# Patient Record
Sex: Female | Born: 1980 | Race: White | Hispanic: No | Marital: Married | Smoking: Never smoker
Health system: Southern US, Community
[De-identification: ages and names within clinical notes are randomized; demographics above are authoritative.]

## PROBLEM LIST (undated history)

## (undated) DIAGNOSIS — R102 Pelvic and perineal pain: Secondary | ICD-10-CM

## (undated) DIAGNOSIS — J45909 Unspecified asthma, uncomplicated: Secondary | ICD-10-CM

## (undated) DIAGNOSIS — T7840XA Allergy, unspecified, initial encounter: Secondary | ICD-10-CM

## (undated) DIAGNOSIS — F32A Depression, unspecified: Secondary | ICD-10-CM

## (undated) DIAGNOSIS — F909 Attention-deficit hyperactivity disorder, unspecified type: Secondary | ICD-10-CM

## (undated) DIAGNOSIS — D649 Anemia, unspecified: Secondary | ICD-10-CM

## (undated) DIAGNOSIS — F419 Anxiety disorder, unspecified: Secondary | ICD-10-CM

## (undated) DIAGNOSIS — T8859XA Other complications of anesthesia, initial encounter: Secondary | ICD-10-CM

## (undated) DIAGNOSIS — K589 Irritable bowel syndrome without diarrhea: Secondary | ICD-10-CM

## (undated) HISTORY — DX: Allergy, unspecified, initial encounter: T78.40XA

## (undated) HISTORY — DX: Unspecified asthma, uncomplicated: J45.909

## (undated) HISTORY — DX: Irritable bowel syndrome, unspecified: K58.9

---
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
## 2000-01-12 ENCOUNTER — Emergency Department (HOSPITAL_COMMUNITY): Admission: EM | Admit: 2000-01-12 | Discharge: 2000-01-12 | Payer: Self-pay | Admitting: Emergency Medicine

## 2004-09-29 HISTORY — PX: RHINOPLASTY: SHX2354

## 2009-06-05 ENCOUNTER — Inpatient Hospital Stay (HOSPITAL_COMMUNITY): Admission: AD | Admit: 2009-06-05 | Discharge: 2009-06-05 | Payer: Self-pay | Admitting: Obstetrics and Gynecology

## 2009-06-13 ENCOUNTER — Inpatient Hospital Stay (HOSPITAL_COMMUNITY): Admission: AD | Admit: 2009-06-13 | Discharge: 2009-06-15 | Payer: Self-pay | Admitting: Obstetrics and Gynecology

## 2009-10-18 ENCOUNTER — Other Ambulatory Visit: Admission: RE | Admit: 2009-10-18 | Discharge: 2009-10-18 | Payer: Self-pay | Admitting: Obstetrics and Gynecology

## 2011-01-03 LAB — RPR: RPR Ser Ql: NONREACTIVE

## 2011-01-03 LAB — CBC
HCT: 29.6 % — ABNORMAL LOW (ref 36.0–46.0)
HCT: 36.2 % (ref 36.0–46.0)
Hemoglobin: 9.8 g/dL — ABNORMAL LOW (ref 12.0–15.0)
Hemoglobin: 12.2 g/dL (ref 12.0–15.0)
MCHC: 33.2 g/dL (ref 30.0–36.0)
MCHC: 33.8 g/dL (ref 30.0–36.0)
MCV: 82.8 fL (ref 78.0–100.0)
MCV: 82 fL (ref 78.0–100.0)
Platelets: 201 10*3/uL (ref 150–400)
Platelets: 258 10*3/uL (ref 150–400)
RBC: 3.58 MIL/uL — ABNORMAL LOW (ref 3.87–5.11)
RBC: 4.41 MIL/uL (ref 3.87–5.11)
RDW: 14.8 % (ref 11.5–15.5)
RDW: 14.3 % (ref 11.5–15.5)
WBC: 17.5 10*3/uL — ABNORMAL HIGH (ref 4.0–10.5)
WBC: 10.7 10*3/uL — ABNORMAL HIGH (ref 4.0–10.5)

## 2011-01-08 ENCOUNTER — Other Ambulatory Visit: Payer: Self-pay | Admitting: Family Medicine

## 2011-01-08 ENCOUNTER — Other Ambulatory Visit (HOSPITAL_COMMUNITY)
Admission: RE | Admit: 2011-01-08 | Discharge: 2011-01-08 | Disposition: A | Discharge status: Home / Self Care | Payer: 59 | Source: Ambulatory Visit | Attending: Family Medicine | Admitting: Family Medicine

## 2011-01-08 DIAGNOSIS — Z124 Encounter for screening for malignant neoplasm of cervix: Secondary | ICD-10-CM | POA: Insufficient documentation

## 2012-07-15 ENCOUNTER — Other Ambulatory Visit (HOSPITAL_COMMUNITY): Payer: Self-pay | Admitting: Gastroenterology

## 2012-07-15 DIAGNOSIS — R1011 Right upper quadrant pain: Secondary | ICD-10-CM

## 2012-07-19 ENCOUNTER — Encounter (HOSPITAL_COMMUNITY)
Admission: RE | Admit: 2012-07-19 | Discharge: 2012-07-19 | Disposition: A | Discharge status: Home / Self Care | Payer: BC Managed Care – PPO | Source: Ambulatory Visit | Attending: Gastroenterology | Admitting: Gastroenterology

## 2012-07-19 DIAGNOSIS — R1011 Right upper quadrant pain: Secondary | ICD-10-CM

## 2012-07-19 MED ORDER — SINCALIDE 5 MCG IJ SOLR
0.0200 ug/kg | Freq: Once | INTRAMUSCULAR | Status: AC
  Administered 2012-07-19: 1.3 ug via INTRAVENOUS

## 2012-07-19 MED ORDER — TECHNETIUM TC 99M MEBROFENIN IV KIT
5.0000 | PACK | Freq: Once | INTRAVENOUS | Status: AC | PRN
  Administered 2012-07-19: 5 via INTRAVENOUS

## 2012-07-20 ENCOUNTER — Encounter (INDEPENDENT_AMBULATORY_CARE_PROVIDER_SITE_OTHER): Payer: Self-pay | Admitting: Surgery

## 2012-07-21 ENCOUNTER — Encounter (INDEPENDENT_AMBULATORY_CARE_PROVIDER_SITE_OTHER): Payer: Self-pay | Admitting: Surgery

## 2012-07-21 ENCOUNTER — Ambulatory Visit (INDEPENDENT_AMBULATORY_CARE_PROVIDER_SITE_OTHER): Payer: BC Managed Care – PPO | Admitting: Surgery

## 2012-07-21 VITALS — BP 100/62 | HR 54 | Resp 12 | Ht 61.0 in | Wt 147.0 lb

## 2012-07-21 DIAGNOSIS — K811 Chronic cholecystitis: Secondary | ICD-10-CM

## 2012-07-21 NOTE — Progress Notes (Signed)
Patient ID: Tamara Wilson, female   DOB: Sep 01, 1981, 31 y.o.   MRN: [id number]  Chief Complaint  Patient presents with  . Other    abdominal pain    HPI Tamara Wilson is a 31 y.o. female.   HPIshe is referred by Dr. Dulce Sellar for evaluation of abdominal pain. She's been having epigastric and right upper quadrant abdominal pain hurting through to her back for about 3 weeks. She has had nausea but no vomiting. She has had a history of irritable bowel syndrome. The pain is moderate in intensity and sharp. She has loose bowel movements. She is otherwise without complaints  Past Medical History  Diagnosis Date  . Allergy   . IBS (irritable bowel syndrome)   . Asthma     Past Surgical History  Procedure Date  . Tonsillectomy 1986  . Rhinoplasty 2006    History reviewed. No pertinent family history.  Social History History  Substance Use Topics  . Smoking status: Never Smoker   . Smokeless tobacco: Not on file  . Alcohol Use: Yes     rare beer    Allergies  Allergen Reactions  . Sulfa Antibiotics     Current Outpatient Prescriptions  Medication Sig Dispense Refill  . fexofenadine (ALLEGRA) 180 MG tablet Take 180 mg by mouth daily.      . norethindrone-ethinyl estradiol (MICROGESTIN,JUNEL,LOESTRIN) 1-20 MG-MCG tablet Take 1 tablet by mouth daily.      Marland Kitchen omeprazole (PRILOSEC) 20 MG capsule Take 20 mg by mouth daily.      . polyethylene glycol (MIRALAX / GLYCOLAX) packet Take 17 g by mouth daily.      . Probiotic Product (SOLUBLE FIBER/PROBIOTICS PO) Take by mouth 2 (two) times daily.        Review of Systems Review of Systems  Constitutional: Negative for fever, chills and unexpected weight change.  HENT: Negative for hearing loss, congestion, sore throat, trouble swallowing and voice change.   Eyes: Negative for visual disturbance.  Respiratory: Negative for cough and wheezing.   Cardiovascular: Negative for chest pain, palpitations and leg swelling.    Gastrointestinal: Positive for nausea, abdominal pain and diarrhea. Negative for vomiting, constipation, blood in stool, abdominal distention and anal bleeding.  Genitourinary: Negative for hematuria, vaginal bleeding and difficulty urinating.  Musculoskeletal: Negative for arthralgias.  Skin: Negative for rash and wound.  Neurological: Negative for seizures, syncope and headaches.  Hematological: Negative for adenopathy. Does not bruise/bleed easily.  Psychiatric/Behavioral: Negative for confusion.    Blood pressure 100/62, pulse 54, resp. rate 12, height 5\' 1"  (1.549 m), weight 147 lb (66.679 kg), last menstrual period 07/10/2012.  Physical Exam Physical Exam  Constitutional: She is oriented to person, place, and time. She appears well-developed and well-nourished. No distress.  HENT:  Head: Normocephalic and atraumatic.  Right Ear: External ear normal.  Left Ear: External ear normal.  Nose: Nose normal.  Mouth/Throat: Oropharynx is clear and moist. No oropharyngeal exudate.  Eyes: Conjunctivae normal are normal. Pupils are equal, round, and reactive to light. Right eye exhibits no discharge. Left eye exhibits no discharge. No scleral icterus.  Neck: Normal range of motion. Neck supple. No tracheal deviation present. No thyromegaly present.  Cardiovascular: Normal rate, regular rhythm, normal heart sounds and intact distal pulses.   Pulmonary/Chest: Effort normal and breath sounds normal. No respiratory distress. She has no wheezes. She has no rales.  Abdominal: Soft. Bowel sounds are normal. There is tenderness. There is guarding. There is no rebound.  There is mild tenderness with guarding in the right upper quadrant  Musculoskeletal: Normal range of motion. She exhibits no edema and no tenderness.  Lymphadenopathy:    She has no cervical adenopathy.  Neurological: She is alert and oriented to person, place, and time.  Skin: Skin is warm and dry. No rash noted. She is not  diaphoretic. No erythema.  Psychiatric: Her behavior is normal. Judgment normal.    Data Reviewed I have reviewed her ultrasound, HIDA, and endoscopy report  Assessment    Biliary dyskinesia and chronic cholecystitis    Plan    After a long discussion, she is eager to proceed with laparoscopic cholecystectomy. I discussed this with her in detail. I discussed the risk of surgery which includes but is not limited to bleeding, infection, bile duct injury, bile leak, injury to other structures, the chance this may not resolve her symptoms, etc. She understands and wishes to proceed. Surgery will thus be scheduled. Likelihood of success is good       Afton Lavalle A 07/21/2012, 3:26 PM

## 2012-07-21 NOTE — Progress Notes (Deleted)
Subjective:     Patient ID: Tamara Wilson, female   DOB: Jun 08, 1981, 31 y.o.   MRN: [id number]  HPI   Review of Systems     Objective:   Physical Exam     Assessment:     ***    Plan:     ***

## 2012-07-22 ENCOUNTER — Encounter (HOSPITAL_COMMUNITY): Payer: Self-pay | Admitting: Pharmacy Technician

## 2012-07-22 ENCOUNTER — Encounter (INDEPENDENT_AMBULATORY_CARE_PROVIDER_SITE_OTHER): Payer: Self-pay | Admitting: General Surgery

## 2012-08-03 ENCOUNTER — Encounter (HOSPITAL_COMMUNITY)
Admission: RE | Admit: 2012-08-03 | Discharge: 2012-08-03 | Disposition: A | Discharge status: Home / Self Care | Payer: BC Managed Care – PPO | Source: Ambulatory Visit | Attending: Surgery | Admitting: Surgery

## 2012-08-03 ENCOUNTER — Encounter (HOSPITAL_COMMUNITY): Payer: Self-pay

## 2012-08-03 LAB — CBC
HCT: 39.5 % (ref 36.0–46.0)
Hemoglobin: 13.6 g/dL (ref 12.0–15.0)
MCH: 29.2 pg (ref 26.0–34.0)
MCHC: 34.4 g/dL (ref 30.0–36.0)
MCV: 84.8 fL (ref 78.0–100.0)
Platelets: 267 10*3/uL (ref 150–400)
RBC: 4.66 MIL/uL (ref 3.87–5.11)
RDW: 13.2 % (ref 11.5–15.5)
WBC: 8.9 10*3/uL (ref 4.0–10.5)

## 2012-08-03 LAB — SURGICAL PCR SCREEN
MRSA, PCR: NEGATIVE
Staphylococcus aureus: POSITIVE — AB

## 2012-08-03 LAB — HCG, SERUM, QUALITATIVE: Preg, Serum: NEGATIVE

## 2012-08-03 NOTE — Pre-Procedure Instructions (Signed)
20 Armella P Sakai  08/03/2012   Your procedure is scheduled on:  November 8  Report to [facility] at 10:30 AM.  Call this number if you have problems the morning of surgery: [phone]   Remember:   Do not eat or drink:After Midnight.  Take these medicines the morning of surgery with A SIP OF WATER: Birth Control, Omeprazole   Do not wear jewelry, make-up or nail polish.  Do not wear lotions, powders, or perfumes. You may wear deodorant.  Do not shave 48 hours prior to surgery. Men may shave face and neck.  Do not bring valuables to the hospital.  Contacts, dentures or bridgework may not be worn into surgery.  Leave suitcase in the car. After surgery it may be brought to your room.  For patients admitted to the hospital, checkout time is 11:00 AM the day of discharge.   Patients discharged the day of surgery will not be allowed to drive home.  Name and phone number of your driver: Husband  Special Instructions: Shower using CHG 2 nights before surgery and the night before surgery.  If you shower the day of surgery use CHG.  Use special wash - you have one bottle of CHG for all showers.  You should use approximately 1/3 of the bottle for each shower.   Please read over the following fact sheets that you were given: Pain Booklet, Coughing and Deep Breathing and Surgical Site Infection Prevention

## 2012-08-05 MED ORDER — CEFAZOLIN SODIUM-DEXTROSE 2-3 GM-% IV SOLR
2.0000 g | INTRAVENOUS | Status: AC
  Administered 2012-08-06: 2 g via INTRAVENOUS

## 2012-08-05 NOTE — H&P (Signed)
Patient ID: Tamara Wilson, female DOB: 10-30-1980, 31 y.o. MRN: [id number]  Chief Complaint   Patient presents with   .  Other     abdominal pain    HPI  Tamara Wilson is a 31 y.o. female.  HPIshe is referred by Dr. Dulce Sellar for evaluation of abdominal pain. She's been having epigastric and right upper quadrant abdominal pain hurting through to her back for about 3 weeks. She has had nausea but no vomiting. She has had a history of irritable bowel syndrome. The pain is moderate in intensity and sharp. She has loose bowel movements. She is otherwise without complaints  Past Medical History   Diagnosis  Date   .  Allergy    .  IBS (irritable bowel syndrome)    .  Asthma     Past Surgical History   Procedure  Date   .  Tonsillectomy  1986   .  Rhinoplasty  2006    History reviewed. No pertinent family history.  Social History  History   Substance Use Topics   .  Smoking status:  Never Smoker   .  Smokeless tobacco:  Not on file   .  Alcohol Use:  Yes      rare beer    Allergies   Allergen  Reactions   .  Sulfa Antibiotics     Current Outpatient Prescriptions   Medication  Sig  Dispense  Refill   .  fexofenadine (ALLEGRA) 180 MG tablet  Take 180 mg by mouth daily.     .  norethindrone-ethinyl estradiol (MICROGESTIN,JUNEL,LOESTRIN) 1-20 MG-MCG tablet  Take 1 tablet by mouth daily.     Marland Kitchen  omeprazole (PRILOSEC) 20 MG capsule  Take 20 mg by mouth daily.     .  polyethylene glycol (MIRALAX / GLYCOLAX) packet  Take 17 g by mouth daily.     .  Probiotic Product (SOLUBLE FIBER/PROBIOTICS PO)  Take by mouth 2 (two) times daily.      Review of Systems  Review of Systems  Constitutional: Negative for fever, chills and unexpected weight change.  HENT: Negative for hearing loss, congestion, sore throat, trouble swallowing and voice change.  Eyes: Negative for visual disturbance.  Respiratory: Negative for cough and wheezing.  Cardiovascular: Negative for chest pain, palpitations  and leg swelling.  Gastrointestinal: Positive for nausea, abdominal pain and diarrhea. Negative for vomiting, constipation, blood in stool, abdominal distention and anal bleeding.  Genitourinary: Negative for hematuria, vaginal bleeding and difficulty urinating.  Musculoskeletal: Negative for arthralgias.  Skin: Negative for rash and wound.  Neurological: Negative for seizures, syncope and headaches.  Hematological: Negative for adenopathy. Does not bruise/bleed easily.  Psychiatric/Behavioral: Negative for confusion.   Blood pressure 100/62, pulse 54, resp. rate 12, height 5\' 1"  (1.549 m), weight 147 lb (66.679 kg), last menstrual period 07/10/2012.  Physical Exam  Physical Exam  Constitutional: She is oriented to person, place, and time. She appears well-developed and well-nourished. No distress.  HENT:  Head: Normocephalic and atraumatic.  Right Ear: External ear normal.  Left Ear: External ear normal.  Nose: Nose normal.  Mouth/Throat: Oropharynx is clear and moist. No oropharyngeal exudate.  Eyes: Conjunctivae normal are normal. Pupils are equal, round, and reactive to light. Right eye exhibits no discharge. Left eye exhibits no discharge. No scleral icterus.  Neck: Normal range of motion. Neck supple. No tracheal deviation present. No thyromegaly present.  Cardiovascular: Normal rate, regular rhythm, normal heart sounds and intact distal pulses.  Pulmonary/Chest: Effort normal and breath sounds normal. No respiratory distress. She has no wheezes. She has no rales.  Abdominal: Soft. Bowel sounds are normal. There is tenderness. There is guarding. There is no rebound.  There is mild tenderness with guarding in the right upper quadrant  Musculoskeletal: Normal range of motion. She exhibits no edema and no tenderness.  Lymphadenopathy:  She has no cervical adenopathy.  Neurological: She is alert and oriented to person, place, and time.  Skin: Skin is warm and dry. No rash noted. She is  not diaphoretic. No erythema.  Psychiatric: Her behavior is normal. Judgment normal.   Data Reviewed  I have reviewed her ultrasound, HIDA, and endoscopy report  Assessment   Biliary dyskinesia and chronic cholecystitis   Plan   After a long discussion, she is eager to proceed with laparoscopic cholecystectomy. I discussed this with her in detail. I discussed the risk of surgery which includes but is not limited to bleeding, infection, bile duct injury, bile leak, injury to other structures, the chance this may not resolve her symptoms, etc. She understands and wishes to proceed. Surgery will thus be scheduled. Likelihood of success is good   Tamara Wilson A

## 2012-08-06 ENCOUNTER — Encounter (HOSPITAL_COMMUNITY): Payer: Self-pay | Admitting: Anesthesiology

## 2012-08-06 ENCOUNTER — Encounter (HOSPITAL_COMMUNITY)
Admission: RE | Disposition: A | Discharge status: Home / Self Care | Payer: Self-pay | Source: Ambulatory Visit | Attending: Surgery

## 2012-08-06 ENCOUNTER — Ambulatory Visit (HOSPITAL_COMMUNITY)
Admission: RE | Admit: 2012-08-06 | Discharge: 2012-08-06 | Disposition: A | Discharge status: Home / Self Care | Payer: BC Managed Care – PPO | Source: Ambulatory Visit | Attending: Surgery | Admitting: Surgery

## 2012-08-06 ENCOUNTER — Ambulatory Visit (HOSPITAL_COMMUNITY): Payer: BC Managed Care – PPO | Admitting: Anesthesiology

## 2012-08-06 DIAGNOSIS — K811 Chronic cholecystitis: Secondary | ICD-10-CM

## 2012-08-06 DIAGNOSIS — K819 Cholecystitis, unspecified: Secondary | ICD-10-CM | POA: Insufficient documentation

## 2012-08-06 DIAGNOSIS — J45909 Unspecified asthma, uncomplicated: Secondary | ICD-10-CM | POA: Insufficient documentation

## 2012-08-06 DIAGNOSIS — Z01812 Encounter for preprocedural laboratory examination: Secondary | ICD-10-CM | POA: Insufficient documentation

## 2012-08-06 DIAGNOSIS — K828 Other specified diseases of gallbladder: Secondary | ICD-10-CM | POA: Insufficient documentation

## 2012-08-06 HISTORY — PX: CHOLECYSTECTOMY: SHX55

## 2012-08-06 SURGERY — LAPAROSCOPIC CHOLECYSTECTOMY
Anesthesia: General | Site: Abdomen | Wound class: Contaminated

## 2012-08-06 MED ORDER — SODIUM CHLORIDE 0.9 % IJ SOLN: 3.0000 mL | INTRAMUSCULAR | Status: DC | PRN

## 2012-08-06 MED ORDER — SODIUM CHLORIDE 0.9 % IV SOLN: 250.0000 mL | INTRAVENOUS | Status: DC | PRN

## 2012-08-06 MED ORDER — MORPHINE SULFATE 4 MG/ML IJ SOLN: 4.0000 mg | INTRAMUSCULAR | Status: DC | PRN

## 2012-08-06 MED ORDER — ONDANSETRON HCL 4 MG/2ML IJ SOLN
INTRAMUSCULAR | Status: DC | PRN
  Administered 2012-08-06: 4 mg via INTRAVENOUS

## 2012-08-06 MED ORDER — BUPIVACAINE-EPINEPHRINE PF 0.25-1:200000 % IJ SOLN
INTRAMUSCULAR | Status: AC
  Filled 2012-08-06: qty 30

## 2012-08-06 MED ORDER — LACTATED RINGERS IV SOLN
INTRAVENOUS | Status: DC | PRN
  Administered 2012-08-06: 11:00:00 via INTRAVENOUS

## 2012-08-06 MED ORDER — SODIUM CHLORIDE 0.9 % IJ SOLN: 3.0000 mL | Freq: Two times a day (BID) | INTRAMUSCULAR | Status: DC

## 2012-08-06 MED ORDER — CEFAZOLIN SODIUM 1-5 GM-% IV SOLN
INTRAVENOUS | Status: AC
  Filled 2012-08-06: qty 100

## 2012-08-06 MED ORDER — HYDROMORPHONE HCL PF 1 MG/ML IJ SOLN
INTRAMUSCULAR | Status: AC
  Filled 2012-08-06: qty 1

## 2012-08-06 MED ORDER — 0.9 % SODIUM CHLORIDE (POUR BTL) OPTIME
TOPICAL | Status: DC | PRN
  Administered 2012-08-06: 1000 mL

## 2012-08-06 MED ORDER — ONDANSETRON HCL 4 MG/2ML IJ SOLN: 4.0000 mg | Freq: Four times a day (QID) | INTRAMUSCULAR | Status: DC | PRN

## 2012-08-06 MED ORDER — OXYCODONE HCL 5 MG/5ML PO SOLN: 5.0000 mg | Freq: Once | ORAL | Status: DC | PRN

## 2012-08-06 MED ORDER — BUPIVACAINE-EPINEPHRINE 0.25% -1:200000 IJ SOLN
INTRAMUSCULAR | Status: DC | PRN
  Administered 2012-08-06: 20 mL

## 2012-08-06 MED ORDER — ACETAMINOPHEN 325 MG PO TABS: 650.0000 mg | ORAL_TABLET | ORAL | Status: DC | PRN

## 2012-08-06 MED ORDER — OXYCODONE HCL 5 MG PO TABS: 5.0000 mg | ORAL_TABLET | Freq: Once | ORAL | Status: DC | PRN

## 2012-08-06 MED ORDER — MIDAZOLAM HCL 5 MG/5ML IJ SOLN
INTRAMUSCULAR | Status: DC | PRN
  Administered 2012-08-06: 2 mg via INTRAVENOUS

## 2012-08-06 MED ORDER — LIDOCAINE HCL (CARDIAC) 20 MG/ML IV SOLN
INTRAVENOUS | Status: DC | PRN
  Administered 2012-08-06: 80 mg via INTRAVENOUS

## 2012-08-06 MED ORDER — MIDAZOLAM HCL 2 MG/2ML IJ SOLN: 1.0000 mg | INTRAMUSCULAR | Status: DC | PRN

## 2012-08-06 MED ORDER — ROCURONIUM BROMIDE 100 MG/10ML IV SOLN
INTRAVENOUS | Status: DC | PRN
  Administered 2012-08-06: 25 mg via INTRAVENOUS

## 2012-08-06 MED ORDER — PROPOFOL 10 MG/ML IV BOLUS
INTRAVENOUS | Status: DC | PRN
  Administered 2012-08-06: 180 mg via INTRAVENOUS

## 2012-08-06 MED ORDER — FENTANYL CITRATE 0.05 MG/ML IJ SOLN
INTRAMUSCULAR | Status: DC | PRN
  Administered 2012-08-06 (×2): 50 ug via INTRAVENOUS
  Administered 2012-08-06: 100 ug via INTRAVENOUS
  Administered 2012-08-06: 50 ug via INTRAVENOUS

## 2012-08-06 MED ORDER — KETOROLAC TROMETHAMINE 30 MG/ML IJ SOLN
INTRAMUSCULAR | Status: DC | PRN
  Administered 2012-08-06: 30 mg via INTRAVENOUS

## 2012-08-06 MED ORDER — ACETAMINOPHEN 650 MG RE SUPP: 650.0000 mg | RECTAL | Status: DC | PRN

## 2012-08-06 MED ORDER — GLYCOPYRROLATE 0.2 MG/ML IJ SOLN
INTRAMUSCULAR | Status: DC | PRN
  Administered 2012-08-06: 0.2 mg via INTRAVENOUS
  Administered 2012-08-06: 0.4 mg via INTRAVENOUS
  Administered 2012-08-06: 0.2 mg via INTRAVENOUS

## 2012-08-06 MED ORDER — HYDROCODONE-ACETAMINOPHEN 5-325 MG PO TABS: 1.0000 | ORAL_TABLET | ORAL | Status: DC | PRN

## 2012-08-06 MED ORDER — SODIUM CHLORIDE 0.9 % IR SOLN
Status: DC | PRN
  Administered 2012-08-06: 1000 mL

## 2012-08-06 MED ORDER — FENTANYL CITRATE 0.05 MG/ML IJ SOLN: 50.0000 ug | Freq: Once | INTRAMUSCULAR | Status: DC

## 2012-08-06 MED ORDER — HYDROMORPHONE HCL PF 1 MG/ML IJ SOLN
0.2500 mg | INTRAMUSCULAR | Status: DC | PRN
  Administered 2012-08-06: 0.5 mg via INTRAVENOUS

## 2012-08-06 MED ORDER — OXYCODONE HCL 5 MG PO TABS: 5.0000 mg | ORAL_TABLET | ORAL | Status: DC | PRN

## 2012-08-06 MED ORDER — PROMETHAZINE HCL 25 MG/ML IJ SOLN: 6.2500 mg | INTRAMUSCULAR | Status: DC | PRN

## 2012-08-06 MED ORDER — NEOSTIGMINE METHYLSULFATE 1 MG/ML IJ SOLN
INTRAMUSCULAR | Status: DC | PRN
  Administered 2012-08-06: 3 mg via INTRAVENOUS

## 2012-08-06 SURGICAL SUPPLY — 45 items
APL SKNCLS STERI-STRIP NONHPOA (GAUZE/BANDAGES/DRESSINGS) ×1
APPLIER CLIP 5 13 M/L LIGAMAX5 (MISCELLANEOUS) ×2
APR CLP MED LRG 5 ANG JAW (MISCELLANEOUS) ×1
BAG SPEC RTRVL LRG 6X4 10 (ENDOMECHANICALS) ×1
BANDAGE ADHESIVE 1X3 (GAUZE/BANDAGES/DRESSINGS) ×5 IMPLANT
BENZOIN TINCTURE PRP APPL 2/3 (GAUZE/BANDAGES/DRESSINGS) ×2 IMPLANT
CANISTER SUCTION 2500CC (MISCELLANEOUS) ×2 IMPLANT
CHLORAPREP W/TINT 26ML (MISCELLANEOUS) ×2 IMPLANT
CLIP APPLIE 5 13 M/L LIGAMAX5 (MISCELLANEOUS) ×1 IMPLANT
CLOTH BEACON ORANGE TIMEOUT ST (SAFETY) ×2 IMPLANT
COVER MAYO STAND STRL (DRAPES) IMPLANT
COVER SURGICAL LIGHT HANDLE (MISCELLANEOUS) ×2 IMPLANT
DECANTER SPIKE VIAL GLASS SM (MISCELLANEOUS) ×2 IMPLANT
DRAPE C-ARM 42X72 X-RAY (DRAPES) IMPLANT
ELECT REM PT RETURN 9FT ADLT (ELECTROSURGICAL) ×2
ELECTRODE REM PT RTRN 9FT ADLT (ELECTROSURGICAL) ×1 IMPLANT
GLOVE BIO SURGEON STRL SZ7.5 (GLOVE) ×1 IMPLANT
GLOVE BIOGEL PI IND STRL 6.5 (GLOVE) IMPLANT
GLOVE BIOGEL PI IND STRL 7.0 (GLOVE) IMPLANT
GLOVE BIOGEL PI IND STRL 7.5 (GLOVE) IMPLANT
GLOVE BIOGEL PI INDICATOR 6.5 (GLOVE) ×1
GLOVE BIOGEL PI INDICATOR 7.0 (GLOVE) ×1
GLOVE BIOGEL PI INDICATOR 7.5 (GLOVE) ×2
GLOVE SURG SIGNA 7.5 PF LTX (GLOVE) ×2 IMPLANT
GLOVE SURG SS PI 6.5 STRL IVOR (GLOVE) ×1 IMPLANT
GOWN PREVENTION PLUS XLARGE (GOWN DISPOSABLE) ×2 IMPLANT
GOWN STRL NON-REIN LRG LVL3 (GOWN DISPOSABLE) ×6 IMPLANT
KIT BASIN OR (CUSTOM PROCEDURE TRAY) ×2 IMPLANT
KIT ROOM TURNOVER OR (KITS) ×2 IMPLANT
NS IRRIG 1000ML POUR BTL (IV SOLUTION) ×2 IMPLANT
PAD ARMBOARD 7.5X6 YLW CONV (MISCELLANEOUS) ×4 IMPLANT
POUCH SPECIMEN RETRIEVAL 10MM (ENDOMECHANICALS) ×1 IMPLANT
SCISSORS LAP 5X35 DISP (ENDOMECHANICALS) ×1 IMPLANT
SET CHOLANGIOGRAPH 5 50 .035 (SET/KITS/TRAYS/PACK) IMPLANT
SET IRRIG TUBING LAPAROSCOPIC (IRRIGATION / IRRIGATOR) ×2 IMPLANT
SLEEVE ENDOPATH XCEL 5M (ENDOMECHANICALS) ×4 IMPLANT
SPECIMEN JAR SMALL (MISCELLANEOUS) ×2 IMPLANT
STRIP CLOSURE SKIN 1/2X4 (GAUZE/BANDAGES/DRESSINGS) ×1 IMPLANT
SUT MON AB 4-0 PC3 18 (SUTURE) ×2 IMPLANT
TOWEL OR 17X24 6PK STRL BLUE (TOWEL DISPOSABLE) ×2 IMPLANT
TOWEL OR 17X26 10 PK STRL BLUE (TOWEL DISPOSABLE) ×2 IMPLANT
TRAY LAPAROSCOPIC (CUSTOM PROCEDURE TRAY) ×2 IMPLANT
TROCAR XCEL BLUNT TIP 100MML (ENDOMECHANICALS) ×2 IMPLANT
TROCAR XCEL NON-BLD 5MMX100MML (ENDOMECHANICALS) ×2 IMPLANT
WATER STERILE IRR 1000ML POUR (IV SOLUTION) IMPLANT

## 2012-08-06 NOTE — Anesthesia Preprocedure Evaluation (Signed)
Anesthesia Evaluation  Patient identified by MRN, date of birth, ID band Patient awake    Reviewed: Allergy & Precautions, H&P , NPO status , Patient's Chart, lab work & pertinent test results  Airway Mallampati: I TM Distance: >3 FB Neck ROM: Full    Dental   Pulmonary asthma ,  breath sounds clear to auscultation        Cardiovascular Rhythm:Regular Rate:Normal     Neuro/Psych    GI/Hepatic GERD-  ,  Endo/Other    Renal/GU      Musculoskeletal   Abdominal   Peds  Hematology   Anesthesia Other Findings   Reproductive/Obstetrics                           Anesthesia Physical Anesthesia Plan  ASA: II  Anesthesia Plan: General   Post-op Pain Management:    Induction: Intravenous  Airway Management Planned: Oral ETT  Additional Equipment:   Intra-op Plan:   Post-operative Plan: Extubation in OR  Informed Consent: I have reviewed the patients History and Physical, chart, labs and discussed the procedure including the risks, benefits and alternatives for the proposed anesthesia with the patient or authorized representative who has indicated his/her understanding and acceptance.     Plan Discussed with: CRNA and Surgeon  Anesthesia Plan Comments:         Anesthesia Quick Evaluation  

## 2012-08-06 NOTE — Interval H&P Note (Signed)
History and Physical Interval Note:  No change in H and P  08/06/2012 10:41 AM  Tamara Wilson  has presented today for surgery, with the diagnosis of chronic cholecystitis  The various methods of treatment have been discussed with the patient and family. After consideration of risks, benefits and other options for treatment, the patient has consented to  Procedure(s) (LRB) with comments: LAPAROSCOPIC CHOLECYSTECTOMY (N/A) as a surgical intervention .  The patient's history has been reviewed, patient examined, no change in status, stable for surgery.  I have reviewed the patient's chart and labs.  Questions were answered to the patient's satisfaction.     [name] A

## 2012-08-06 NOTE — Addendum Note (Signed)
Addendum  created 08/06/12 1337 by Sheppard Evens, CRNA   Modules edited:Anesthesia Medication Administration

## 2012-08-06 NOTE — Transfer of Care (Signed)
Immediate Anesthesia Transfer of Care Note  Patient: Tamara Wilson  Procedure(s) Performed: Procedure(s) (LRB) with comments: LAPAROSCOPIC CHOLECYSTECTOMY (N/A)  Patient Location: PACU  Anesthesia Type:General  Level of Consciousness: awake, alert  and oriented  Airway & Oxygen Therapy: Patient Spontanous Breathing  Post-op Assessment: Report given to PACU RN and Post -op Vital signs reviewed and stable  Post vital signs: Reviewed and stable  Complications: No apparent anesthesia complications

## 2012-08-06 NOTE — Addendum Note (Signed)
Addendum  created 08/06/12 1337 by Sumit Branham B Zebulen Simonis, CRNA   Modules edited:Anesthesia Medication Administration    

## 2012-08-06 NOTE — Anesthesia Postprocedure Evaluation (Signed)
  Anesthesia Post-op Note  Patient: Tamara Wilson  Procedure(s) Performed: Procedure(s) (LRB) with comments: LAPAROSCOPIC CHOLECYSTECTOMY (N/A)  Patient Location: PACU  Anesthesia Type:General  Level of Consciousness: awake  Airway and Oxygen Therapy: Patient Spontanous Breathing  Post-op Pain: mild  Post-op Assessment: Post-op Vital signs reviewed, Patient's Cardiovascular Status Stable, Respiratory Function Stable, Patent Airway, No signs of Nausea or vomiting and Pain level controlled  Post-op Vital Signs: stable  Complications: No apparent anesthesia complications

## 2012-08-06 NOTE — Op Note (Signed)
Laparoscopic Cholecystectomy Procedure Note  Indications: This patient presents with symptomatic gallbladder disease and will undergo laparoscopic cholecystectomy.  Pre-operative Diagnosis: biliary dyskinesia  Post-operative Diagnosis: Same  Surgeon: Abigail Miyamoto A   Assistants: 0  Anesthesia: General endotracheal anesthesia  ASA Class: 1  Procedure Details  The patient was seen again in the Holding Room. The risks, benefits, complications, treatment options, and expected outcomes were discussed with the patient. The possibilities of reaction to medication, pulmonary aspiration, perforation of viscus, bleeding, recurrent infection, finding a normal gallbladder, the need for additional procedures, failure to diagnose a condition, the possible need to convert to an open procedure, and creating a complication requiring transfusion or operation were discussed with the patient. The likelihood of improving the patient's symptoms with return to their baseline status is good.  The patient and/or family concurred with the proposed plan, giving informed consent. The site of surgery properly noted. The patient was taken to Operating Room, identified as Tamara Wilson and the procedure verified as Laparoscopic Cholecystectomy with Intraoperative Cholangiogram. A Time Out was held and the above information confirmed.  Prior to the induction of general anesthesia, antibiotic prophylaxis was administered. General endotracheal anesthesia was then administered and tolerated well. After the induction, the abdomen was prepped with Chloraprep and draped in sterile fashion. The patient was positioned in the supine position.  Local anesthetic agent was injected into the skin near the umbilicus and an incision made. We dissected down to the abdominal fascia with blunt dissection.  The fascia was incised vertically and we entered the peritoneal cavity bluntly.  A pursestring suture of 0-Vicryl was placed around  the fascial opening.  The Hasson cannula was inserted and secured with the stay suture.  Pneumoperitoneum was then created with CO2 and tolerated well without any adverse changes in the patient's vital signs. An 11-mm port was placed in the subxiphoid position.  Two 5-mm ports were placed in the right upper quadrant. All skin incisions were infiltrated with a local anesthetic agent before making the incision and placing the trocars.   We positioned the patient in reverse Trendelenburg, tilted slightly to the patient's left.  The gallbladder was identified, the fundus grasped and retracted cephalad. Adhesions were lysed bluntly and with the electrocautery where indicated, taking care not to injure any adjacent organs or viscus. The infundibulum was grasped and retracted laterally, exposing the peritoneum overlying the triangle of Calot. This was then divided and exposed in a blunt fashion. The cystic duct was clearly identified and bluntly dissected circumferentially. A critical view of the cystic duct and cystic artery was obtained.  The cystic duct was then ligated with clips and divided. The cystic artery was, dissected free, ligated with clips and divided as well.   The gallbladder was dissected from the liver bed in retrograde fashion with the electrocautery. The gallbladder was removed and placed in an Endocatch sac. The liver bed was irrigated and inspected. Hemostasis was achieved with the electrocautery. Copious irrigation was utilized and was repeatedly aspirated until clear.  The gallbladder and Endocatch sac were then removed through the umbilical port site.  The pursestring suture was used to close the umbilical fascia.    We again inspected the right upper quadrant for hemostasis.  Pneumoperitoneum was released as we removed the trocars.  4-0 Monocryl was used to close the skin.   Benzoin, steri-strips, and clean dressings were applied. The patient was then extubated and brought to the recovery  room in stable condition. Instrument, sponge, and needle  counts were correct at closure and at the conclusion of the case.   Findings: Cholecystitis without Cholelithiasis  Estimated Blood Loss: Minimal         Drains: 0         Specimens: Gallbladder           Complications: None; patient tolerated the procedure well.         Disposition: PACU - hemodynamically stable.         Condition: stable

## 2012-08-09 ENCOUNTER — Encounter (HOSPITAL_COMMUNITY): Payer: Self-pay | Admitting: Surgery

## 2012-08-23 ENCOUNTER — Encounter (INDEPENDENT_AMBULATORY_CARE_PROVIDER_SITE_OTHER): Payer: Self-pay | Admitting: Surgery

## 2012-08-23 ENCOUNTER — Ambulatory Visit (INDEPENDENT_AMBULATORY_CARE_PROVIDER_SITE_OTHER): Payer: BC Managed Care – PPO | Admitting: Surgery

## 2012-08-23 VITALS — BP 127/80 | HR 76 | Temp 97.6°F | Resp 16 | Ht 61.5 in | Wt 146.2 lb

## 2012-08-23 DIAGNOSIS — Z09 Encounter for follow-up examination after completed treatment for conditions other than malignant neoplasm: Secondary | ICD-10-CM

## 2012-08-23 NOTE — Progress Notes (Signed)
Subjective:     Patient ID: Tamara Wilson, female   DOB: 1981/07/15, 31 y.o.   MRN: [id number]  HPI She is here for her first postoperative visit. She is doing well and has had total resolution of her symptoms preoperatively. She is eating well and moving her bowels well  Review of Systems     Objective:   Physical Exam Her incisions are all healing well. The final pathology showed chronic cholecystitis    Assessment:     Patient doing well postop    Plan:     She may return to normal activity. I will see her back as needed

## 2012-10-15 ENCOUNTER — Encounter (INDEPENDENT_AMBULATORY_CARE_PROVIDER_SITE_OTHER): Payer: Self-pay

## 2012-10-18 ENCOUNTER — Encounter: Payer: Self-pay | Admitting: *Deleted

## 2012-10-18 ENCOUNTER — Emergency Department
Admission: EM | Admit: 2012-10-18 | Discharge: 2012-10-18 | Disposition: A | Discharge status: Home / Self Care | Payer: BC Managed Care – PPO | Source: Home / Self Care | Attending: Family Medicine | Admitting: Family Medicine

## 2012-10-18 DIAGNOSIS — J029 Acute pharyngitis, unspecified: Secondary | ICD-10-CM

## 2012-10-18 DIAGNOSIS — J069 Acute upper respiratory infection, unspecified: Secondary | ICD-10-CM

## 2012-10-18 LAB — POCT RAPID STREP A (OFFICE): Rapid Strep A Screen: NEGATIVE

## 2012-10-18 NOTE — ED Provider Notes (Signed)
History     CSN: [id number]  Arrival date & time 10/18/12  1457   First MD Initiated Contact with Patient 10/18/12 1514      Chief Complaint  Patient presents with  . Sore Throat  . Cough   HPI  URI Symptoms Onset: 2-3 days  Description: rhinorrhea, nasal congestion, cough, sore throat  Modifying factors:  Daughter with + strep throat. Similar sxs   Symptoms Nasal discharge: yes Fever: no Sore throat: yes Cough: yes Wheezing: no Ear pain: no GI symptoms: no Sick contacts: yes  Red Flags  Stiff neck: no Dyspnea: no Rash: no Swallowing difficulty: no  Sinusitis Risk Factors Headache/face pain: no Double sickening: no tooth pain: no  Allergy Risk Factors Sneezing: no Itchy scratchy throat: no Seasonal symptoms: no  Flu Risk Factors Headache: no muscle aches: no severe fatigue: no    Past Medical History  Diagnosis Date  . Allergy   . IBS (irritable bowel syndrome)   . Asthma     Childhood    Past Surgical History  Procedure Date  . Tonsillectomy 1986  . Rhinoplasty 2006  . Cholecystectomy 08/06/2012    Procedure: LAPAROSCOPIC CHOLECYSTECTOMY;  Surgeon: Shelly Rubenstein, MD;  Location: [facility] OR;  Service: General;  Laterality: N/A;    History reviewed. No pertinent family history.  History  Substance Use Topics  . Smoking status: Never Smoker   . Smokeless tobacco: Not on file  . Alcohol Use: Yes     Comment: rare beer    OB History    Grav Para Term Preterm Abortions TAB SAB Ect Mult Living                  Review of Systems  All other systems reviewed and are negative.    Allergies  Sulfa antibiotics  Home Medications   Current Outpatient Rx  Name  Route  Sig  Dispense  Refill  . FEXOFENADINE HCL 180 MG PO TABS   Oral   Take 180 mg by mouth daily.         Marland Kitchen HYDROCODONE-ACETAMINOPHEN 5-325 MG PO TABS   Oral   Take 1-2 tablets by mouth every 4 (four) hours as needed for pain.   30 tablet   1   . NORETHINDRONE  ACET-ETHINYL EST 1-20 MG-MCG PO TABS   Oral   Take 1 tablet by mouth daily.         Marland Kitchen OMEPRAZOLE 20 MG PO CPDR   Oral   Take 20 mg by mouth daily.         Marland Kitchen POLYETHYLENE GLYCOL 3350 PO PACK   Oral   Take 17 g by mouth daily.         Marland Kitchen PROBIOTIC PO   Oral   Take 2 capsules by mouth.           BP 119/69  Pulse 69  Temp 98.4 F (36.9 C) (Oral)  Resp 16  SpO2 98%  LMP 10/04/2012  Physical Exam  Constitutional: She appears well-developed and well-nourished.  HENT:  Head: Normocephalic and atraumatic.  Right Ear: External ear normal.  Left Ear: External ear normal.       +nasal erythema, rhinorrhea bilaterally, + post oropharyngeal erythema    Eyes: Pupils are equal, round, and reactive to light.  Neck: Normal range of motion. Neck supple.  Cardiovascular: Normal rate and regular rhythm.   Pulmonary/Chest: Effort normal and breath sounds normal.  Abdominal: Soft.  Musculoskeletal: Normal range of motion.  Neurological: She is alert.  Skin: Skin is warm.    ED Course  Procedures (including critical care time)   Labs Reviewed  POCT RAPID STREP A (OFFICE)  STREP A DNA PROBE   No results found.   1. Acute pharyngitis   2. URI (upper respiratory infection)       MDM  Rapid strep negative.  Will culture.  Symptomatic care in the interim.  Discussed general care and infectious red flags.  Follow up as needed.     The patient and/or caregiver has been counseled thoroughly with regard to treatment plan and/or medications prescribed including dosage, schedule, interactions, rationale for use, and possible side effects and they verbalize understanding. Diagnoses and expected course of recovery discussed and will return if not improved as expected or if the condition worsens. Patient and/or caregiver verbalized understanding.             Doree Albee, MD 10/18/12 1525

## 2012-10-18 NOTE — ED Notes (Signed)
Pt c/o sore throat and cough. 

## 2012-10-19 LAB — STREP A DNA PROBE: GASP: NEGATIVE

## 2012-10-20 ENCOUNTER — Telehealth: Payer: Self-pay | Admitting: Emergency Medicine

## 2013-03-04 LAB — OB RESULTS CONSOLE RPR: RPR: NONREACTIVE

## 2013-03-04 LAB — OB RESULTS CONSOLE ABO/RH: RH Type: POSITIVE

## 2013-03-04 LAB — OB RESULTS CONSOLE ANTIBODY SCREEN: Antibody Screen: NEGATIVE

## 2013-03-21 ENCOUNTER — Other Ambulatory Visit (HOSPITAL_COMMUNITY)
Admission: RE | Admit: 2013-03-21 | Discharge: 2013-03-21 | Disposition: A | Discharge status: Home / Self Care | Payer: BC Managed Care – PPO | Source: Ambulatory Visit | Attending: Obstetrics and Gynecology | Admitting: Obstetrics and Gynecology

## 2013-03-21 ENCOUNTER — Other Ambulatory Visit: Payer: Self-pay | Admitting: Obstetrics and Gynecology

## 2013-03-21 DIAGNOSIS — Z01419 Encounter for gynecological examination (general) (routine) without abnormal findings: Secondary | ICD-10-CM | POA: Insufficient documentation

## 2013-03-21 DIAGNOSIS — Z1151 Encounter for screening for human papillomavirus (HPV): Secondary | ICD-10-CM | POA: Insufficient documentation

## 2013-03-21 LAB — OB RESULTS CONSOLE GC/CHLAMYDIA
Chlamydia: NEGATIVE
Gonorrhea: NEGATIVE

## 2013-08-02 LAB — OB RESULTS CONSOLE HIV ANTIBODY (ROUTINE TESTING): HIV: NONREACTIVE

## 2013-08-02 LAB — OB RESULTS CONSOLE GBS: GBS: NEGATIVE

## 2013-08-11 ENCOUNTER — Ambulatory Visit (HOSPITAL_COMMUNITY): Payer: BC Managed Care – PPO

## 2013-08-18 ENCOUNTER — Ambulatory Visit (HOSPITAL_COMMUNITY): Payer: BC Managed Care – PPO

## 2013-09-01 ENCOUNTER — Ambulatory Visit (HOSPITAL_COMMUNITY): Payer: BC Managed Care – PPO | Attending: Obstetrics and Gynecology

## 2013-09-29 NOTE — L&D Delivery Note (Signed)
Operative Delivery Note At 7:27 PM a viable female was delivered via Vaginal, Spontaneous Delivery.  Presentation: vertex; Position: Left,, Occiput,, Anterior;  Delivery of the head, Nuchal cord x 1 manually reduced First maneuver: 10/25/2013  7:26 PM, McRoberts Second maneuver: 10/25/2013  7:26 PM, Suprapubic Pressure    Head delivered in less than 30 seconds.  Good tone noted.  Movement of both arms.  APGAR: 7, 9; weight 8 lb 7.3 oz (3836 g).   Placenta status: Intact, Spontaneous.   Cord: 3 vessels with the following complications: None.  Cord pH: n/a Umbilical cord blood collected for donation.  Cord blood collected after cord clamp.  Anesthesia: Epidural  Episiotomy: None Lacerations: 2nd degree Suture Repair: 2.0 3.0 chromic Est. Blood Loss (mL): 350  Mom to mother-baby.  Baby to Couplet care / Skin to Skin.  Tamara Wilson, Tamara Oyer 10/25/2013, 10:51 PM

## 2013-10-24 ENCOUNTER — Other Ambulatory Visit: Payer: Self-pay | Admitting: Obstetrics and Gynecology

## 2013-10-24 ENCOUNTER — Encounter (HOSPITAL_COMMUNITY): Payer: Self-pay

## 2013-10-24 ENCOUNTER — Inpatient Hospital Stay (HOSPITAL_COMMUNITY)
Admission: AD | Admit: 2013-10-24 | Discharge: 2013-10-27 | DRG: 775 | Disposition: A | Discharge status: Home / Self Care | Payer: BC Managed Care – PPO | Source: Ambulatory Visit | Attending: Obstetrics and Gynecology | Admitting: Obstetrics and Gynecology

## 2013-10-24 DIAGNOSIS — J45909 Unspecified asthma, uncomplicated: Secondary | ICD-10-CM | POA: Diagnosis present

## 2013-10-24 DIAGNOSIS — O99814 Abnormal glucose complicating childbirth: Principal | ICD-10-CM | POA: Diagnosis present

## 2013-10-24 DIAGNOSIS — O24410 Gestational diabetes mellitus in pregnancy, diet controlled: Secondary | ICD-10-CM | POA: Diagnosis present

## 2013-10-24 LAB — CBC
HCT: 30.5 % — ABNORMAL LOW (ref 36.0–46.0)
Hemoglobin: 10.2 g/dL — ABNORMAL LOW (ref 12.0–15.0)
MCH: 24.9 pg — ABNORMAL LOW (ref 26.0–34.0)
MCHC: 33.4 g/dL (ref 30.0–36.0)
MCV: 74.6 fL — ABNORMAL LOW (ref 78.0–100.0)
Platelets: 268 10*3/uL (ref 150–400)
RBC: 4.09 MIL/uL (ref 3.87–5.11)
RDW: 14.8 % (ref 11.5–15.5)
WBC: 10.4 10*3/uL (ref 4.0–10.5)

## 2013-10-24 LAB — GLUCOSE, RANDOM: Glucose, Bld: 89 mg/dL (ref 70–99)

## 2013-10-24 MED ORDER — OXYTOCIN BOLUS FROM INFUSION
500.0000 mL | INTRAVENOUS | Status: DC
  Administered 2013-10-25: 500 mL via INTRAVENOUS

## 2013-10-24 MED ORDER — MISOPROSTOL 25 MCG QUARTER TABLET
25.0000 ug | ORAL_TABLET | ORAL | Status: DC | PRN
  Administered 2013-10-25: 25 ug via VAGINAL
  Filled 2013-10-24: qty 1
  Filled 2013-10-24: qty 0.25

## 2013-10-24 MED ORDER — OXYCODONE-ACETAMINOPHEN 5-325 MG PO TABS: 1.0000 | ORAL_TABLET | ORAL | Status: DC | PRN

## 2013-10-24 MED ORDER — ACETAMINOPHEN 325 MG PO TABS: 650.0000 mg | ORAL_TABLET | ORAL | Status: DC | PRN

## 2013-10-24 MED ORDER — ONDANSETRON HCL 4 MG/2ML IJ SOLN: 4.0000 mg | Freq: Four times a day (QID) | INTRAMUSCULAR | Status: DC | PRN

## 2013-10-24 MED ORDER — TERBUTALINE SULFATE 1 MG/ML IJ SOLN: 0.2500 mg | Freq: Once | INTRAMUSCULAR | Status: AC | PRN

## 2013-10-24 MED ORDER — ZOLPIDEM TARTRATE 5 MG PO TABS
5.0000 mg | ORAL_TABLET | Freq: Every evening | ORAL | Status: DC | PRN
  Administered 2013-10-24: 5 mg via ORAL
  Filled 2013-10-24: qty 1

## 2013-10-24 MED ORDER — LACTATED RINGERS IV SOLN
500.0000 mL | INTRAVENOUS | Status: DC | PRN
  Administered 2013-10-25: 1000 mL via INTRAVENOUS

## 2013-10-24 MED ORDER — OXYTOCIN 40 UNITS IN LACTATED RINGERS INFUSION - SIMPLE MED
1.0000 m[IU]/min | INTRAVENOUS | Status: DC
  Filled 2013-10-24: qty 1000

## 2013-10-24 MED ORDER — OXYTOCIN 40 UNITS IN LACTATED RINGERS INFUSION - SIMPLE MED: 62.5000 mL/h | INTRAVENOUS | Status: DC

## 2013-10-24 MED ORDER — BUTORPHANOL TARTRATE 1 MG/ML IJ SOLN: 1.0000 mg | INTRAMUSCULAR | Status: DC | PRN

## 2013-10-24 MED ORDER — LIDOCAINE HCL (PF) 1 % IJ SOLN
30.0000 mL | INTRAMUSCULAR | Status: DC | PRN
  Filled 2013-10-24: qty 30

## 2013-10-24 MED ORDER — IBUPROFEN 600 MG PO TABS
600.0000 mg | ORAL_TABLET | Freq: Four times a day (QID) | ORAL | Status: DC | PRN
  Filled 2013-10-24: qty 1

## 2013-10-24 MED ORDER — LACTATED RINGERS IV SOLN: INTRAVENOUS | Status: DC

## 2013-10-24 MED ORDER — CITRIC ACID-SODIUM CITRATE 334-500 MG/5ML PO SOLN: 30.0000 mL | ORAL | Status: DC | PRN

## 2013-10-24 NOTE — H&P (Addendum)
Tamara Wilson is a 33 y.o. female G2 P1001 at 4439 4/7 weeks c/w a 6 week ultrasound with Gestational Diabetes well controlled with diet presenting for IOL.  Pt had prodromal labor after membranes were stripped 5 days ago.  Pt presented for ob appt today with irregular contractions.  Denied LOF or VB.  Upon arrival to birthing suites, pt felt LOF but ferning was negative per RN.  Contractions have increased in frequency and intensity since leaving the office. Pt was diagnosed with gestational diabetes which has been well controlled.  QID testing revealed very few abnormal readings.  Testing decreased to BID.  Ultrasound done 1 week ago showed EFW 3763 grams (8 lbs 4 oz +/- 8 oz), >90%. Pt was induced ~ 4 years ago for PROM.  Pt reports Pitocin was used and causes excruitiating  pain.  She requests to use alternative induction agents.  She also has a doula who will come to the hospital when she is in active labor.  Maternal Medical History:  Reason for admission: Contractions.   Contractions: Onset was more than 2 days ago.   Frequency: irregular.    Fetal activity: Perceived fetal activity is normal.    Prenatal Complications - Diabetes: gestational. Diabetes is managed by diet.      OB History   Grav Para Term Preterm Abortions TAB SAB Ect Mult Living   1              Past Medical History  Diagnosis Date  . Allergy   . IBS (irritable bowel syndrome)   . Asthma     Childhood   Past Surgical History  Procedure Laterality Date  . Tonsillectomy  1986  . Rhinoplasty  2006  . Cholecystectomy  08/06/2012    Procedure: LAPAROSCOPIC CHOLECYSTECTOMY;  Surgeon: Shelly Rubensteinouglas A Blackman, MD;  Location: [facility] OR;  Service: General;  Laterality: N/A;   Family History: family history is not on file. Social History:  reports that she has never smoked. She has never used smokeless tobacco. She reports that she drinks alcohol. She reports that she does not use illicit drugs.  Review of Systems   Constitutional: Negative for fever and chills.  Cardiovascular: Positive for leg swelling.  Gastrointestinal: Positive for abdominal pain.  Neurological: Negative for headaches.    Dilation: 1.5 Effacement (%): Thick Station: Ballotable Exam by:: Dr.Spring San Blood pressure 108/55, pulse 70, temperature 97.8 F (36.6 C), temperature source Axillary, resp. rate 16, height 5' 1.5" (1.562 m), weight 79.379 kg (175 lb), last menstrual period 01/20/2013. Maternal Exam:  Uterine Assessment: Contraction strength is moderate.  Contraction frequency is irregular.   Abdomen: Estimated fetal weight is 8 1/2 lbs.   Fetal presentation: vertex  Introitus: Normal vulva. Normal vagina.  Ferning test: negative.  Amniotic fluid character: not assessed. Ferning negative per RN.  No pooling seen.  Pelvis: adequate for delivery.   Cervix: Cervix evaluated by sterile speculum exam and digital exam.     Fetal Exam Fetal Monitor Review: Mode: fetoscope.   Baseline rate: 120s-130s.  Variability: marked (>25 bpm).   Pattern: accelerations present.    Fetal State Assessment: Category I - tracings are normal.     Physical Exam  Constitutional: She is oriented to person, place, and time. She appears well-developed and well-nourished. No distress.  HENT:  Head: Normocephalic and atraumatic.  Eyes: EOM are normal.  Neck: Normal range of motion.  Cardiovascular: Normal rate, regular rhythm and normal heart sounds.   Respiratory: Effort normal and  breath sounds normal. No respiratory distress.  GI: There is no tenderness.  Genitourinary: Vagina normal and uterus normal.  Musculoskeletal: Normal range of motion. She exhibits edema. She exhibits no tenderness.  Neurological: She is alert and oriented to person, place, and time.  Skin: Skin is warm and dry. She is not diaphoretic.  Psychiatric: She has a normal mood and affect.    Foley inserted via sterile technique with betadine and a sterile  speculum.  Prenatal labs: ABO, Rh: O/Positive/-- (06/06 0000) Antibody: Negative (06/06 0000) Rubella:   RPR: Nonreactive (06/06 0000)  HBsAg:    HIV: Non-reactive (11/04 0000)  GBS:   Negative  Glucose 89.  Assessment/Plan: IUP @ 39 4/7 weeks, Gestational Diabetes A1, well controlled. Latent Labor, s/p Foley bulb placement AROM if possible when foley bulb expelled. GDM A1.  Glucose normal.  CBG q 4 hours in active labor. Ambien overnight. Stadol or epidural per pt request. Reviewed risk of vaginal delivery including shoulder dystocia.    Geryl Rankins 10/24/2013, 10:49 PM

## 2013-10-25 ENCOUNTER — Encounter (HOSPITAL_COMMUNITY): Payer: Self-pay | Admitting: *Deleted

## 2013-10-25 ENCOUNTER — Inpatient Hospital Stay (HOSPITAL_COMMUNITY): Payer: BC Managed Care – PPO | Admitting: Anesthesiology

## 2013-10-25 ENCOUNTER — Encounter (HOSPITAL_COMMUNITY): Payer: BC Managed Care – PPO | Admitting: Anesthesiology

## 2013-10-25 LAB — GLUCOSE, CAPILLARY: Glucose-Capillary: 92 mg/dL (ref 70–99)

## 2013-10-25 LAB — RPR: RPR Ser Ql: NONREACTIVE

## 2013-10-25 LAB — RUBELLA SCREEN: Rubella: 2.14 Index — ABNORMAL HIGH (ref <0.90)

## 2013-10-25 LAB — HEPATITIS B SURFACE ANTIGEN: Hepatitis B Surface Ag: NEGATIVE

## 2013-10-25 MED ORDER — PHENYLEPHRINE 40 MCG/ML (10ML) SYRINGE FOR IV PUSH (FOR BLOOD PRESSURE SUPPORT)
80.0000 ug | PREFILLED_SYRINGE | INTRAVENOUS | Status: DC | PRN
  Filled 2013-10-25: qty 2

## 2013-10-25 MED ORDER — FENTANYL 2.5 MCG/ML BUPIVACAINE 1/10 % EPIDURAL INFUSION (WH - ANES): 14.0000 mL/h | INTRAMUSCULAR | Status: DC | PRN

## 2013-10-25 MED ORDER — EPHEDRINE 5 MG/ML INJ
10.0000 mg | INTRAVENOUS | Status: DC | PRN
  Filled 2013-10-25: qty 2

## 2013-10-25 MED ORDER — LACTATED RINGERS IV SOLN: 500.0000 mL | Freq: Once | INTRAVENOUS | Status: DC

## 2013-10-25 MED ORDER — LIDOCAINE HCL (PF) 1 % IJ SOLN
INTRAMUSCULAR | Status: DC | PRN
  Administered 2013-10-25 (×2): 5 mL

## 2013-10-25 MED ORDER — FENTANYL 2.5 MCG/ML BUPIVACAINE 1/10 % EPIDURAL INFUSION (WH - ANES)
INTRAMUSCULAR | Status: AC
  Administered 2013-10-25: 14 mL/h via EPIDURAL
  Filled 2013-10-25: qty 125

## 2013-10-25 MED ORDER — OXYTOCIN 40 UNITS IN LACTATED RINGERS INFUSION - SIMPLE MED
1.0000 m[IU]/min | INTRAVENOUS | Status: DC
  Administered 2013-10-25: 2 m[IU]/min via INTRAVENOUS

## 2013-10-25 MED ORDER — MISOPROSTOL 200 MCG PO TABS: 800.0000 ug | ORAL_TABLET | Freq: Once | ORAL | Status: AC | PRN

## 2013-10-25 MED ORDER — PHENYLEPHRINE 40 MCG/ML (10ML) SYRINGE FOR IV PUSH (FOR BLOOD PRESSURE SUPPORT)
PREFILLED_SYRINGE | INTRAVENOUS | Status: AC
  Filled 2013-10-25: qty 10

## 2013-10-25 MED ORDER — DIPHENHYDRAMINE HCL 50 MG/ML IJ SOLN: 12.5000 mg | INTRAMUSCULAR | Status: DC | PRN

## 2013-10-25 MED ORDER — EPHEDRINE 5 MG/ML INJ
INTRAVENOUS | Status: AC
  Filled 2013-10-25: qty 4

## 2013-10-25 MED ORDER — TERBUTALINE SULFATE 1 MG/ML IJ SOLN: 0.2500 mg | Freq: Once | INTRAMUSCULAR | Status: AC | PRN

## 2013-10-25 NOTE — Progress Notes (Signed)
Shandell P Rendall is a 33 y.o. G2P1001 at 8054w5d   Subjective: Contractions more uncomfortable.  Objective: BP 104/59  Pulse 74  Temp(Src) 97.2 F (36.2 C) (Oral)  Resp 18  Ht 5' 1.5" (1.562 m)  Wt 79.379 kg (175 lb)  BMI 32.53 kg/m2  LMP 01/20/2013      FHT:  Reactive UC:   irregular, every 2-4 minutes SVE:   Dilation: 4.5 Effacement (%): 70 Station: -1 Exam by:: m wilkins rnc AROM clear  Labs: Lab Results  Component Value Date   WBC 10.4 10/24/2013   HGB 10.2* 10/24/2013   HCT 30.5* 10/24/2013   MCV 74.6* 10/24/2013   PLT 268 10/24/2013    Assessment / Plan: IUP at 39 5/7 weeks GDM glucose normal.  Labor: Progressing normally Preeclampsia:  no signs or symptoms of toxicity Fetal Wellbeing:  Category I Pain Control:  Labor support without medications I/D:  n/a Anticipated MOD:  NSVD  Kambree Krauss 10/25/2013, 3:39 PM

## 2013-10-25 NOTE — Anesthesia Procedure Notes (Signed)
Epidural Patient location during procedure: OB Start time: 10/25/2013 3:56 PM  Staffing Anesthesiologist: Brayton CavesJACKSON, Melanie Pellot Performed by: anesthesiologist   Preanesthetic Checklist Completed: patient identified, site marked, surgical consent, pre-op evaluation, timeout performed, IV checked, risks and benefits discussed and monitors and equipment checked  Epidural Patient position: sitting Prep: site prepped and draped and DuraPrep Patient monitoring: continuous pulse ox and blood pressure Approach: midline Injection technique: LOR air  Needle:  Needle type: Tuohy  Needle gauge: 17 G Needle length: 9 cm and 9 Needle insertion depth: 5 cm cm Catheter type: closed end flexible Catheter size: 19 Gauge Catheter at skin depth: 10 cm Test dose: negative  Assessment Events: blood not aspirated, injection not painful, no injection resistance, negative IV test and no paresthesia  Additional Notes Patient identified.  Risk benefits discussed including failed block, incomplete pain control, headache, nerve damage, paralysis, blood pressure changes, nausea, vomiting, reactions to medication both toxic or allergic, and postpartum back pain.  Patient expressed understanding and wished to proceed.  All questions were answered.  Sterile technique used throughout procedure and epidural site dressed with sterile barrier dressing. No paresthesia or other complications noted.The patient did not experience any signs of intravascular injection such as tinnitus or metallic taste in mouth nor signs of intrathecal spread such as rapid motor block. Please see nursing notes for vital signs.

## 2013-10-25 NOTE — Progress Notes (Addendum)
Tamara Wilson is a 33 y.o. G2P1001 at 2173w5d   Subjective: Pt without complaints.  Contractions resolved after Ambien take last night.  Objective: BP 104/59  Pulse 74  Temp(Src) 97.2 F (36.2 C) (Oral)  Resp 18  Ht 5' 1.5" (1.562 m)  Wt 79.379 kg (175 lb)  BMI 32.53 kg/m2  LMP 01/20/2013      FHT:  Reactive, good variablity UC:  Irregular, off monitor q 5 minutes SVE:  Tight 3/thick, midposition, -3 ballotable   Labs: Lab Results  Component Value Date   WBC 10.4 10/24/2013   HGB 10.2* 10/24/2013   HCT 30.5* 10/24/2013   MCV 74.6* 10/24/2013   PLT 268 10/24/2013   CBG ~90  Assessment / Plan: IUP at 39 5/7 weeks Cytotec ripen cervix further.  Pt had an adverse experience with Pitocin.  AROM when head well applied to cervix.  Labor: Latent labor Preeclampsia:  no signs or symptoms of toxicity Fetal Wellbeing:  Category I Pain Control:  Labor support without medications I/D:  n/a Anticipated MOD:  NSVD  Alexavier Tsutsui 10/25/2013, 8:51 AM

## 2013-10-25 NOTE — Anesthesia Preprocedure Evaluation (Signed)
Anesthesia Evaluation  Patient identified by MRN, date of birth, ID band Patient awake    Reviewed: Allergy & Precautions, H&P , Patient's Chart, lab work & pertinent test results  Airway Mallampati: II TM Distance: >3 FB Neck ROM: full    Dental   Pulmonary asthma ,  breath sounds clear to auscultation        Cardiovascular Rhythm:regular Rate:Normal     Neuro/Psych    GI/Hepatic   Endo/Other  diabetes  Renal/GU      Musculoskeletal   Abdominal   Peds  Hematology   Anesthesia Other Findings   Reproductive/Obstetrics (+) Pregnancy                           Anesthesia Physical Anesthesia Plan  ASA: III  Anesthesia Plan: Epidural   Post-op Pain Management:    Induction:   Airway Management Planned:   Additional Equipment:   Intra-op Plan:   Post-operative Plan:   Informed Consent: I have reviewed the patients History and Physical, chart, labs and discussed the procedure including the risks, benefits and alternatives for the proposed anesthesia with the patient or authorized representative who has indicated his/her understanding and acceptance.     Plan Discussed with:   Anesthesia Plan Comments:         Anesthesia Quick Evaluation

## 2013-10-26 LAB — CBC
HCT: 31.8 % — ABNORMAL LOW (ref 36.0–46.0)
Hemoglobin: 10.4 g/dL — ABNORMAL LOW (ref 12.0–15.0)
MCH: 24.2 pg — ABNORMAL LOW (ref 26.0–34.0)
MCHC: 32.7 g/dL (ref 30.0–36.0)
MCV: 74 fL — ABNORMAL LOW (ref 78.0–100.0)
Platelets: 232 10*3/uL (ref 150–400)
RBC: 4.3 MIL/uL (ref 3.87–5.11)
RDW: 15 % (ref 11.5–15.5)
WBC: 18.9 10*3/uL — ABNORMAL HIGH (ref 4.0–10.5)

## 2013-10-26 LAB — ABO/RH: ABO/RH(D): O POS

## 2013-10-26 LAB — CCBB MATERNAL DONOR DRAW

## 2013-10-26 MED ORDER — WITCH HAZEL-GLYCERIN EX PADS
1.0000 "application " | MEDICATED_PAD | CUTANEOUS | Status: DC | PRN
  Administered 2013-10-27: 1 via TOPICAL

## 2013-10-26 MED ORDER — DIPHENHYDRAMINE HCL 25 MG PO CAPS: 25.0000 mg | ORAL_CAPSULE | Freq: Four times a day (QID) | ORAL | Status: DC | PRN

## 2013-10-26 MED ORDER — OXYTOCIN 40 UNITS IN LACTATED RINGERS INFUSION - SIMPLE MED: 62.5000 mL/h | INTRAVENOUS | Status: DC | PRN

## 2013-10-26 MED ORDER — PRENATAL MULTIVITAMIN CH: 1.0000 | ORAL_TABLET | Freq: Every day | ORAL | Status: DC

## 2013-10-26 MED ORDER — OXYCODONE-ACETAMINOPHEN 5-325 MG PO TABS: 1.0000 | ORAL_TABLET | ORAL | Status: DC | PRN

## 2013-10-26 MED ORDER — IBUPROFEN 600 MG PO TABS
600.0000 mg | ORAL_TABLET | Freq: Four times a day (QID) | ORAL | Status: DC
  Administered 2013-10-26 – 2013-10-27 (×6): 600 mg via ORAL
  Filled 2013-10-26 (×5): qty 1

## 2013-10-26 MED ORDER — LANOLIN HYDROUS EX OINT
TOPICAL_OINTMENT | CUTANEOUS | Status: DC | PRN
Start: 2013-10-26 — End: 2013-10-27

## 2013-10-26 MED ORDER — DIBUCAINE 1 % RE OINT: 1.0000 "application " | TOPICAL_OINTMENT | RECTAL | Status: DC | PRN

## 2013-10-26 MED ORDER — METHYLERGONOVINE MALEATE 0.2 MG PO TABS: 0.2000 mg | ORAL_TABLET | ORAL | Status: DC | PRN

## 2013-10-26 MED ORDER — ONDANSETRON HCL 4 MG/2ML IJ SOLN: 4.0000 mg | INTRAMUSCULAR | Status: DC | PRN

## 2013-10-26 MED ORDER — SENNOSIDES-DOCUSATE SODIUM 8.6-50 MG PO TABS
2.0000 | ORAL_TABLET | ORAL | Status: DC
  Administered 2013-10-26: 2 via ORAL
  Filled 2013-10-26: qty 2

## 2013-10-26 MED ORDER — MAGNESIUM HYDROXIDE 400 MG/5ML PO SUSP: 30.0000 mL | ORAL | Status: DC | PRN

## 2013-10-26 MED ORDER — FERROUS SULFATE 325 (65 FE) MG PO TABS
325.0000 mg | ORAL_TABLET | Freq: Two times a day (BID) | ORAL | Status: DC
  Administered 2013-10-26 – 2013-10-27 (×3): 325 mg via ORAL
  Filled 2013-10-26 (×4): qty 1

## 2013-10-26 MED ORDER — BENZOCAINE-MENTHOL 20-0.5 % EX AERO
1.0000 "application " | INHALATION_SPRAY | CUTANEOUS | Status: DC | PRN
  Administered 2013-10-27: 1 via TOPICAL
  Filled 2013-10-26: qty 56

## 2013-10-26 MED ORDER — ZOLPIDEM TARTRATE 5 MG PO TABS: 5.0000 mg | ORAL_TABLET | Freq: Every evening | ORAL | Status: DC | PRN

## 2013-10-26 MED ORDER — MEASLES, MUMPS & RUBELLA VAC ~~LOC~~ INJ
0.5000 mL | INJECTION | Freq: Once | SUBCUTANEOUS | Status: DC
  Filled 2013-10-26: qty 0.5

## 2013-10-26 MED ORDER — TETANUS-DIPHTH-ACELL PERTUSSIS 5-2.5-18.5 LF-MCG/0.5 IM SUSP: 0.5000 mL | Freq: Once | INTRAMUSCULAR | Status: DC

## 2013-10-26 MED ORDER — ONDANSETRON HCL 4 MG PO TABS: 4.0000 mg | ORAL_TABLET | ORAL | Status: DC | PRN

## 2013-10-26 MED ORDER — SIMETHICONE 80 MG PO CHEW: 80.0000 mg | CHEWABLE_TABLET | ORAL | Status: DC | PRN

## 2013-10-26 MED ORDER — METHYLERGONOVINE MALEATE 0.2 MG/ML IJ SOLN: 0.2000 mg | INTRAMUSCULAR | Status: DC | PRN

## 2013-10-26 NOTE — Progress Notes (Signed)
Postpartum day #1, NSVD  Subjective Pt without complaints.  Lochia normal.  Pain controlled.  Breast feeding well  Temp:  [97.7 F (36.5 C)-98.2 F (36.8 C)] 98.2 F (36.8 C) (01/28 1140) Pulse Rate:  [56-132] 88 (01/28 1140) Resp:  [13-18] 17 (01/28 1140) BP: (92-124)/(61-74) 111/65 mmHg (01/28 1140) SpO2:  [95 %-97 %] 95 % (01/28 1140)  Gen:  NAD, A&O x 3 Uterine fundus:  Firm, nontender Lochia normal Ext:  2+Edema, no calf tenderness bilaterally  CBC    Component Value Date/Time   WBC 18.9* 10/26/2013 0540   RBC 4.30 10/26/2013 0540   HGB 10.4* 10/26/2013 0540   HCT 31.8* 10/26/2013 0540   PLT 232 10/26/2013 0540   MCV 74.0* 10/26/2013 0540   MCH 24.2* 10/26/2013 0540   MCHC 32.7 10/26/2013 0540   RDW 15.0 10/26/2013 0540     A/P: S/p SVD doing well. Routine postpartum care. Lactation support. Discharge tomorrow. Circumcision done today without complication.  Geryl RankinsVARNADO, Tylen Leverich 10/26/2013, 6:12 PM

## 2013-10-26 NOTE — Discharge Instructions (Addendum)

## 2013-10-26 NOTE — Anesthesia Postprocedure Evaluation (Signed)
  Anesthesia Post-op Note  Patient: Tamara Wilson  Procedure(s) Performed: * No procedures listed *  Patient Location: PACU and Mother/Baby  Anesthesia Type:Epidural  Level of Consciousness: awake, alert  and oriented  Airway and Oxygen Therapy: Patient Spontanous Breathing  Post-op Pain: none  Post-op Assessment: Post-op Vital signs reviewed, Patient's Cardiovascular Status Stable, No headache, No backache, No residual numbness and No residual motor weakness  Post-op Vital Signs: Reviewed and stable  Complications: No apparent anesthesia complications

## 2013-10-26 NOTE — Lactation Note (Signed)
This note was copied from the chart of Tamara Wilson. Lactation Consultation Note  Patient Name: Tamara Wilson ZOXWR'UToday's Date: 10/26/2013 Reason for consult: Initial assessment Baby 16 hours old. Mom had previous BF experience--8 months. Mom states breastfeeding is going well and baby is latching deeply. Mom is hearing swallows. Baby just nursed and is sleeping. Enc mom to call out for assistance with latch or as needed. [facility], aware of OP/BFSG services. Reviewed basics, enc STS, feeding with cues, and hand massage and expression.   Maternal Data Formula Feeding for Exclusion: No Has patient been taught Hand Expression?: Yes Does the patient have breastfeeding experience prior to this delivery?: Yes  Feeding Feeding Type: Breast Fed Length of feed: 15 min  LATCH Score/Interventions Latch: Grasps breast easily, tongue down, lips flanged, rhythmical sucking.  Audible Swallowing: Spontaneous and intermittent  Type of Nipple: Everted at rest and after stimulation  Comfort (Breast/Nipple): Soft / non-tender     Hold (Positioning): No assistance needed to correctly position infant at breast.  LATCH Score: 10  Lactation Tools Discussed/Used     Consult Status Consult Status: Follow-up Date: 10/27/13 Follow-up type: In-patient    Geralynn OchsWILLIARD, Reegan Mctighe 10/26/2013, 12:28 PM

## 2013-10-27 MED ORDER — IBUPROFEN 600 MG PO TABS
600.0000 mg | ORAL_TABLET | Freq: Four times a day (QID) | ORAL | Status: DC
Start: 2013-10-27 — End: 2023-02-19

## 2013-10-27 NOTE — Discharge Summary (Signed)
Obstetric Discharge Summary Reason for Admission: induction of labor Prenatal Procedures: ultrasound Intrapartum Procedures: spontaneous vaginal delivery and mild shoulder dystocia Postpartum Procedures: none Complications-Operative and Postpartum: 2nd degree perineal laceration Hemoglobin  Date Value Range Status  10/26/2013 10.4* 12.0 - 15.0 g/dL Final     HCT  Date Value Range Status  10/26/2013 31.8* 36.0 - 46.0 % Final    Physical Exam:  General: alert NAD, A&O x3 Lochia: appropriate Uterine Fundus: firm Incision: N/a DVT Evaluation: No evidence of DVT seen on physical exam. Calf/Ankle edema is present.  Discharge Diagnoses: Term Pregnancy-delivered  Discharge Information: Date: 10/27/2013 Activity: pelvic rest Diet: routine Medications: PNV and Ibuprofen Condition: stable Instructions: See discharge instructions. Discharge to: home Follow-up Information   Follow up with Geryl RankinsVARNADO, Eden Toohey, MD. Schedule an appointment as soon as possible for a visit in 6 weeks. (Postpartum check)    Specialty:  Obstetrics and Gynecology   Contact information:   [address] [place] [id number] [phone]       Newborn Data: Live born female  Birth Weight: 8 lb 7.3 oz (3836 g) APGAR: 7, 9  Home with mother. Circumcision done DOL #1  Prathik Aman 10/27/2013, 8:37 AM

## 2014-01-17 ENCOUNTER — Other Ambulatory Visit: Payer: Self-pay | Admitting: Obstetrics and Gynecology

## 2014-07-31 ENCOUNTER — Encounter (HOSPITAL_COMMUNITY): Payer: Self-pay | Admitting: *Deleted

## 2015-08-18 ENCOUNTER — Emergency Department (HOSPITAL_BASED_OUTPATIENT_CLINIC_OR_DEPARTMENT_OTHER): Payer: BLUE CROSS/BLUE SHIELD

## 2015-08-18 ENCOUNTER — Encounter (HOSPITAL_BASED_OUTPATIENT_CLINIC_OR_DEPARTMENT_OTHER): Payer: Self-pay | Admitting: *Deleted

## 2015-08-18 ENCOUNTER — Emergency Department (HOSPITAL_BASED_OUTPATIENT_CLINIC_OR_DEPARTMENT_OTHER)
Admission: EM | Admit: 2015-08-18 | Discharge: 2015-08-18 | Disposition: A | Discharge status: Home / Self Care | Payer: BLUE CROSS/BLUE SHIELD | Attending: Emergency Medicine | Admitting: Emergency Medicine

## 2015-08-18 DIAGNOSIS — Y9289 Other specified places as the place of occurrence of the external cause: Secondary | ICD-10-CM | POA: Diagnosis not present

## 2015-08-18 DIAGNOSIS — Z8719 Personal history of other diseases of the digestive system: Secondary | ICD-10-CM | POA: Diagnosis not present

## 2015-08-18 DIAGNOSIS — S61211A Laceration without foreign body of left index finger without damage to nail, initial encounter: Secondary | ICD-10-CM | POA: Diagnosis present

## 2015-08-18 DIAGNOSIS — Y288XXA Contact with other sharp object, undetermined intent, initial encounter: Secondary | ICD-10-CM | POA: Diagnosis not present

## 2015-08-18 DIAGNOSIS — Y998 Other external cause status: Secondary | ICD-10-CM | POA: Diagnosis not present

## 2015-08-18 DIAGNOSIS — J45909 Unspecified asthma, uncomplicated: Secondary | ICD-10-CM | POA: Diagnosis not present

## 2015-08-18 DIAGNOSIS — Z79899 Other long term (current) drug therapy: Secondary | ICD-10-CM | POA: Insufficient documentation

## 2015-08-18 DIAGNOSIS — Y9389 Activity, other specified: Secondary | ICD-10-CM | POA: Insufficient documentation

## 2015-08-18 DIAGNOSIS — S61219A Laceration without foreign body of unspecified finger without damage to nail, initial encounter: Secondary | ICD-10-CM

## 2015-08-18 MED ORDER — LIDOCAINE-EPINEPHRINE (PF) 2 %-1:200000 IJ SOLN
10.0000 mL | Freq: Once | INTRAMUSCULAR | Status: AC
  Administered 2015-08-18: 10 mL via INTRADERMAL
  Filled 2015-08-18: qty 10

## 2015-08-18 NOTE — ED Notes (Signed)
Laceration to L index finger/bleeding controlled

## 2015-08-18 NOTE — ED Provider Notes (Signed)
CSN: [id number]     Arrival date & time 08/18/15  1421 History   First MD Initiated Contact with Patient 08/18/15 1526     Chief Complaint  Patient presents with  . Extremity Laceration   HPI  [name] is a 34 year old female presenting with a finger laceration. She states that she was cutting up a can when her hand slipped and she cut the posterior aspect of left index finger. She reports immediate pain and bleeding. The bleeding is controlled at presentation to the emergency department. She initially went to urgent care who told her she needed to come to the emergency department for a x-ray of her hand. She states that she can still move all of her digits as before. She denies loss of sensation, numbness or weakness in the digits. She is up-to-date on her tetanus. She has no other complaints at this time.  Past Medical History  Diagnosis Date  . Allergy   . IBS (irritable bowel syndrome)   . Asthma     Childhood   Past Surgical History  Procedure Laterality Date  . Tonsillectomy  1986  . Rhinoplasty  2006  . Cholecystectomy  08/06/2012    Procedure: LAPAROSCOPIC CHOLECYSTECTOMY;  Surgeon: Shelly Rubenstein, MD;  Location: [facility] OR;  Service: General;  Laterality: N/A;   No family history on file. Social History  Substance Use Topics  . Smoking status: Never Smoker   . Smokeless tobacco: Never Used  . Alcohol Use: Yes     Comment: rare beer   OB History    Gravida Para Term Preterm AB TAB SAB Ectopic Multiple Living   Review of Systems  Skin: Positive for wound.  All other systems reviewed and are negative.     Allergies  Sulfa antibiotics  Home Medications   Prior to Admission medications   Medication Sig Start Date End Date Taking? Authorizing Provider  ibuprofen (ADVIL,MOTRIN) 600 MG tablet Take 1 tablet (600 mg total) by mouth every 6 (six) hours. 10/27/13   Geryl Rankins, MD  Prenatal Vit-Fe Fumarate-FA (PRENATAL MULTIVITAMIN) TABS tablet  Take 1 tablet by mouth daily at 12 noon.    Historical Provider, MD  ranitidine (ZANTAC) 75 MG tablet Take 75 mg by mouth daily as needed for heartburn.    Historical Provider, MD   BP 105/71 mmHg  Pulse 56  Temp(Src) 98 F (36.7 C) (Oral)  Resp 20  Ht  (1.549 m)  Wt 140 lb (63.504 kg)  BMI 26.47 kg/m2  SpO2 99%  LMP 08/11/2015 Physical Exam  Constitutional: She appears well-developed and well-nourished. No distress.  HENT:  Head: Normocephalic and atraumatic.  Eyes: Conjunctivae are normal. Right eye exhibits no discharge. Left eye exhibits no discharge. No scleral icterus.  Neck: Normal range of motion.  Cardiovascular: Normal rate, regular rhythm and intact distal pulses.   Refill less than 3 seconds.  Pulmonary/Chest: Effort normal. No respiratory distress.  Musculoskeletal: Normal range of motion.       Left hand: She exhibits laceration. She exhibits normal range of motion, no tenderness and normal capillary refill.       Hands: Retains full range of motion of left 2nd digit. No obvious deformity. Mild tenderness over areas of laceration.  Neurological: She is alert. Coordination normal.  5 out of 5 grip strength of left and right hand. Sensation intact to light touch over her digits on left hand.  Skin: Skin is warm and dry.  3 cm laceration to the posterior left second digit between PIP and DIP. Depth of approximately 0.5 cm. Bleeding controlled at this time. Wound edges approximate well. No foreign bodies visualized.  Psychiatric: She has a normal mood and affect. Her behavior is normal.  Nursing note and vitals reviewed.   ED Course  Procedures (including critical care time)  LACERATION REPAIR Performed by: Simeon CraftStevi M Trenna Kiely Authorized by: Simeon CraftStevi M Ruweyda Macknight Consent: Verbal consent obtained. Risks and benefits: risks, benefits and alternatives were discussed Consent given by: patient Patient identity confirmed: provided demographic data Prepped and Draped in  normal sterile fashion Wound explored  Laceration Location: left index finger  Laceration Length: 3 cm  No Foreign Bodies seen or palpated  Anesthesia: local infiltration  Local anesthetic: lidocaine 2% with epinephrine  Anesthetic total: 5 ml  Irrigation method: syringe Amount of cleaning: standard  Skin closure: 4-0 prolene  Number of sutures: 6  Technique: simple interrupted   Patient tolerance: Patient tolerated the procedure well with no immediate complications.  Labs Review Labs Reviewed - No data to display  Imaging Review Dg Finger Index Left  08/18/2015  CLINICAL DATA:  Laceration of the left index finger. EXAM: LEFT INDEX FINGER 2+V COMPARISON:  None. FINDINGS: There is no evidence of fracture or dislocation. There is no evidence of arthropathy or other focal bone abnormality. Soft tissues are unremarkable. IMPRESSION: Negative. Electronically Signed   By: Francene BoyersJames  Maxwell M.D.   On: 08/18/2015 15:13   I have personally reviewed and evaluated these images and lab results as part of my medical decision-making.   EKG Interpretation None      MDM   Final diagnoses:  Finger laceration, initial encounter   Patient presenting with laceration to left index finger. Patient retains full range of motion of the finger. No neuro deficits of the finger or hand. Finger is vascularly intact. Pressure irrigation performed. Wound explored and base of wound visualized in a bloodless field without evidence of foreign body.  Laceration occurred < 8 hours prior to repair which was well tolerated. Tetanus is up-to-date.  Pt has  no comorbidities to effect normal wound healing. Pt discharged without antibiotics.  Discussed suture home care with patient and answered questions. Pt to follow-up for wound check and suture removal in 7 days; they are to return to the ED sooner for signs of infection. Pt is hemodynamically stable with no complaints prior to dc.      Alveta HeimlichStevi Jerrianne Hartin,  PA-C 08/18/15 [phone]  Tilden FossaElizabeth Rees, MD 08/19/15 1719

## 2015-08-18 NOTE — Discharge Instructions (Signed)
Keep wound dry and clean. Keep finger splint on to prevent reopening the wound. Follow-up with your primary care doctor, an urgent care or the emergency department in 7 days for suture removal. Return with signs of infection.   Laceration Care, Adult A laceration is a cut that goes through all of the layers of the skin and into the tissue that is right under the skin. Some lacerations heal on their own. Others need to be closed with stitches (sutures), staples, skin adhesive strips, or skin glue. Proper laceration care minimizes the risk of infection and helps the laceration to heal better. HOW TO CARE FOR YOUR LACERATION If sutures or staples were used:  Keep the wound clean and dry.  If you were given a bandage (dressing), you should change it at least one time per day or as told by your health care provider. You should also change it if it becomes wet or dirty.  Keep the wound completely dry for the first 24 hours or as told by your health care provider. After that time, you may shower or bathe. However, make sure that the wound is not soaked in water until after the sutures or staples have been removed.  Clean the wound one time each day or as told by your health care provider:  Wash the wound with soap and water.  Rinse the wound with water to remove all soap.  Pat the wound dry with a clean towel. Do not rub the wound.  After cleaning the wound, apply a thin layer of antibiotic ointmentas told by your health care provider. This will help to prevent infection and keep the dressing from sticking to the wound.  Have the sutures or staples removed as told by your health care provider. If skin adhesive strips were used:  Keep the wound clean and dry.  If you were given a bandage (dressing), you should change it at least one time per day or as told by your health care provider. You should also change it if it becomes dirty or wet.  Do not get the skin adhesive strips wet. You may  shower or bathe, but be careful to keep the wound dry.  If the wound gets wet, pat it dry with a clean towel. Do not rub the wound.  Skin adhesive strips fall off on their own. You may trim the strips as the wound heals. Do not remove skin adhesive strips that are still stuck to the wound. They will fall off in time. If skin glue was used:  Try to keep the wound dry, but you may briefly wet it in the shower or bath. Do not soak the wound in water, such as by swimming.  After you have showered or bathed, gently pat the wound dry with a clean towel. Do not rub the wound.  Do not do any activities that will make you sweat heavily until the skin glue has fallen off on its own.  Do not apply liquid, cream, or ointment medicine to the wound while the skin glue is in place. Using those may loosen the film before the wound has healed.  If you were given a bandage (dressing), you should change it at least one time per day or as told by your health care provider. You should also change it if it becomes dirty or wet.  If a dressing is placed over the wound, be careful not to apply tape directly over the skin glue. Doing that may cause the glue  to be pulled off before the wound has healed.  Do not pick at the glue. The skin glue usually remains in place for 5-10 days, then it falls off of the skin. General Instructions  Take over-the-counter and prescription medicines only as told by your health care provider.  If you were prescribed an antibiotic medicine or ointment, take or apply it as told by your doctor. Do not stop using it even if your condition improves.  To help prevent scarring, make sure to cover your wound with sunscreen whenever you are outside after stitches are removed, after adhesive strips are removed, or when glue remains in place and the wound is healed. Make sure to wear a sunscreen of at least 30 SPF.  Do not scratch or pick at the wound.  Keep all follow-up visits as told by  your health care provider. This is important.  Check your wound every day for signs of infection. Watch for:  Redness, swelling, or pain.  Fluid, blood, or pus.  Raise (elevate) the injured area above the level of your heart while you are sitting or lying down, if possible. SEEK MEDICAL CARE IF:  You received a tetanus shot and you have swelling, severe pain, redness, or bleeding at the injection site.  You have a fever.  A wound that was closed breaks open.  You notice a bad smell coming from your wound or your dressing.  You notice something coming out of the wound, such as wood or glass.  Your pain is not controlled with medicine.  You have increased redness, swelling, or pain at the site of your wound.  You have fluid, blood, or pus coming from your wound.  You notice a change in the color of your skin near your wound.  You need to change the dressing frequently due to fluid, blood, or pus draining from the wound.  You develop a new rash.  You develop numbness around the wound. SEEK IMMEDIATE MEDICAL CARE IF:  You develop severe swelling around the wound.  Your pain suddenly increases and is severe.  You develop painful lumps near the wound or on skin that is anywhere on your body.  You have a red streak going away from your wound.  The wound is on your hand or foot and you cannot properly move a finger or toe.  The wound is on your hand or foot and you notice that your fingers or toes look pale or bluish.   This information is not intended to replace advice given to you by your health care provider. Make sure you discuss any questions you have with your health care provider.   Document Released: 09/15/2005 Document Revised: 01/30/2015 Document Reviewed: 09/11/2014 Elsevier  Sutured Wound Care Sutures are stitches that can be used to close wounds. Taking care of your wound properly can help to prevent pain and infection. It can also help your wound to heal  more quickly. HOW TO CARE FOR YOUR SUTURED WOUND Wound Care  Keep the wound clean and dry.  If you were given a bandage (dressing), you should change it at least once per day or as directed by your health care provider. You should also change it if it becomes wet or dirty.  Keep the wound completely dry for the first 24 hours or as directed by your health care provider. After that time, you may shower or bathe. However, make sure that the wound is not soaked in water until the sutures have been removed.  Clean the wound one time each day or as directed by your health care provider.  Wash the wound with soap and water.  Rinse the wound with water to remove all soap.  Pat the wound dry with a clean towel. Do not rub the wound.  Aftercleaning the wound, apply a thin layer of antibioticointment as directed by your health care provider. This will help to prevent infection and keep the dressing from sticking to the wound.  Have the sutures removed as directed by your health care provider. General Instructions  Take or apply medicines only as directed by your health care provider.  To help prevent scarring, make sure to cover your wound with sunscreen whenever you are outside after the sutures are removed and the wound is healed. Make sure to wear a sunscreen of at least 30 SPF.  If you were prescribed an antibiotic medicine or ointment, finish all of it even if you start to feel better.  Do not scratch or pick at the wound.  Keep all follow-up visits as directed by your health care provider. This is important.  Check your wound every day for signs of infection. Watch for:   Redness, swelling, or pain.  Fluid, blood, or pus.  Raise (elevate) the injured area above the level of your heart while you are sitting or lying down, if possible.  Avoid stretching your wound.  Drink enough fluids to keep your urine clear or pale yellow. SEEK MEDICAL CARE IF:  You received a tetanus  shot and you have swelling, severe pain, redness, or bleeding at the injection site.  You have a fever.  A wound that was closed breaks open.  You notice a bad smell coming from the wound.  You notice something coming out of the wound, such as wood or glass.  Your pain is not controlled with medicine.  You have increased redness, swelling, or pain at the site of your wound.  You have fluid, blood, or pus coming from your wound.  You notice a change in the color of your skin near your wound.  You need to change the dressing frequently due to fluid, blood, or pus draining from the wound.  You develop a new rash.  You develop numbness around the wound. SEEK IMMEDIATE MEDICAL CARE IF:  You develop severe swelling around the injury site.  Your pain suddenly increases and is severe.  You develop painful lumps near the wound or on skin that is anywhere on your body.  You have a red streak going away from your wound.  The wound is on your hand or foot and you cannot properly move a finger or toe.  The wound is on your hand or foot and you notice that your fingers or toes look pale or bluish.   This information is not intended to replace advice given to you by your health care provider. Make sure you discuss any questions you have with your health care provider.   Document Released: 10/23/2004 Document Revised: 01/30/2015 Document Reviewed: 04/27/2013 Elsevier Interactive Patient Education [employer] Elsevier Inc.

## 2015-12-25 ENCOUNTER — Ambulatory Visit
Admission: RE | Admit: 2015-12-25 | Discharge: 2015-12-25 | Disposition: A | Discharge status: Home / Self Care | Payer: BLUE CROSS/BLUE SHIELD | Source: Ambulatory Visit | Attending: Family Medicine | Admitting: Family Medicine

## 2015-12-25 ENCOUNTER — Other Ambulatory Visit: Payer: Self-pay | Admitting: Family Medicine

## 2015-12-25 DIAGNOSIS — R059 Cough, unspecified: Secondary | ICD-10-CM

## 2015-12-25 DIAGNOSIS — R05 Cough: Secondary | ICD-10-CM

## 2016-02-05 ENCOUNTER — Other Ambulatory Visit (HOSPITAL_COMMUNITY)
Admission: RE | Admit: 2016-02-05 | Discharge: 2016-02-05 | Disposition: A | Discharge status: Home / Self Care | Payer: BLUE CROSS/BLUE SHIELD | Source: Ambulatory Visit | Attending: Family Medicine | Admitting: Family Medicine

## 2016-02-05 ENCOUNTER — Other Ambulatory Visit: Payer: Self-pay | Admitting: Family Medicine

## 2016-02-05 DIAGNOSIS — Z01419 Encounter for gynecological examination (general) (routine) without abnormal findings: Secondary | ICD-10-CM | POA: Diagnosis not present

## 2016-02-06 LAB — CYTOLOGY - PAP

## 2016-10-23 ENCOUNTER — Other Ambulatory Visit: Payer: Self-pay | Admitting: Sports Medicine

## 2016-10-23 DIAGNOSIS — M25552 Pain in left hip: Secondary | ICD-10-CM

## 2016-11-04 ENCOUNTER — Ambulatory Visit
Admission: RE | Admit: 2016-11-04 | Discharge: 2016-11-04 | Disposition: A | Discharge status: Home / Self Care | Payer: 59 | Source: Ambulatory Visit | Attending: Sports Medicine | Admitting: Sports Medicine

## 2016-11-04 DIAGNOSIS — M25552 Pain in left hip: Secondary | ICD-10-CM

## 2016-11-04 MED ORDER — IOPAMIDOL (ISOVUE-M 200) INJECTION 41%
16.0000 mL | Freq: Once | INTRAMUSCULAR | Status: AC
  Administered 2016-11-04: 16 mL via INTRA_ARTICULAR

## 2017-09-29 HISTORY — PX: OTHER SURGICAL HISTORY: SHX169

## 2018-02-20 IMAGING — CR DG CHEST 2V
2 series · 2 of 2 positions shown · non-contrast
Comparison: None.

CLINICAL DATA: Productive cough, shortness of breath and fever for
5 days.

EXAM:
CHEST  2 VIEW

[w chest pa]
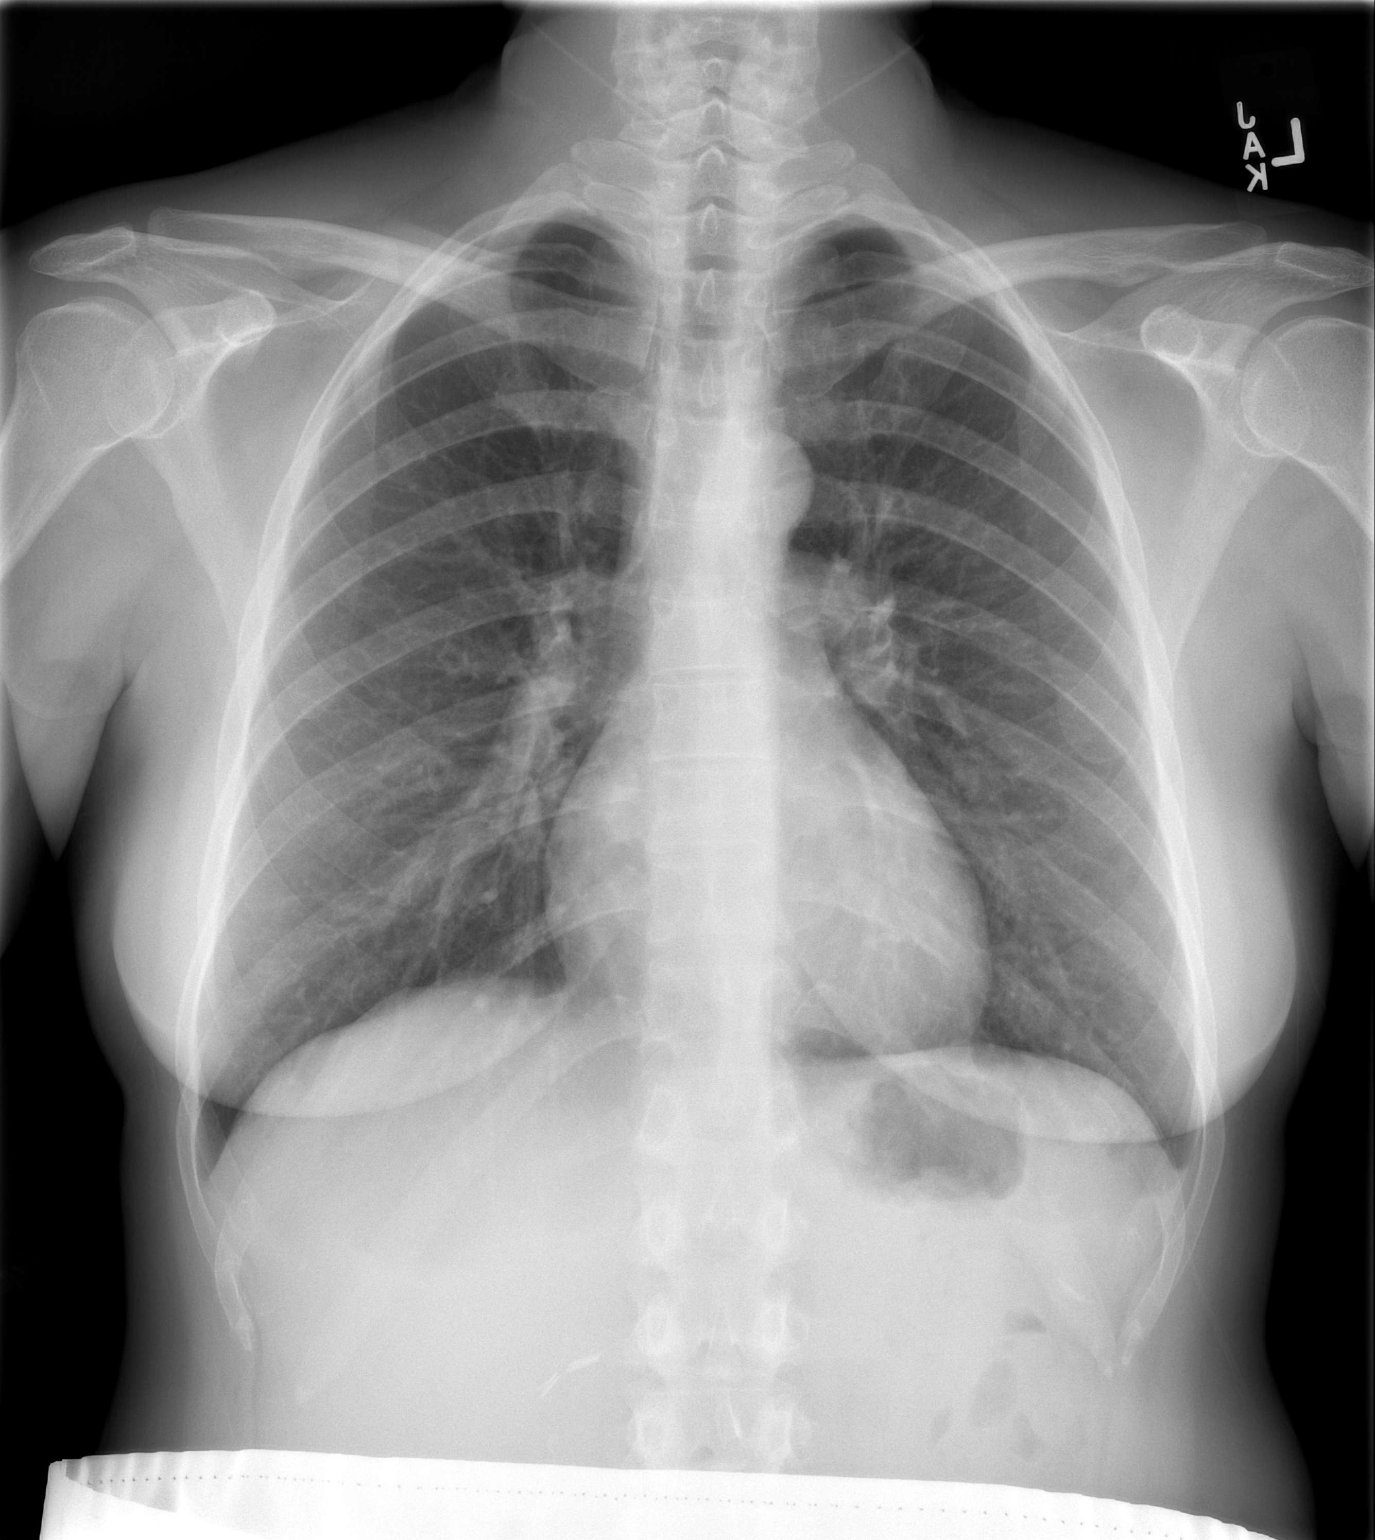

[w chest lat]
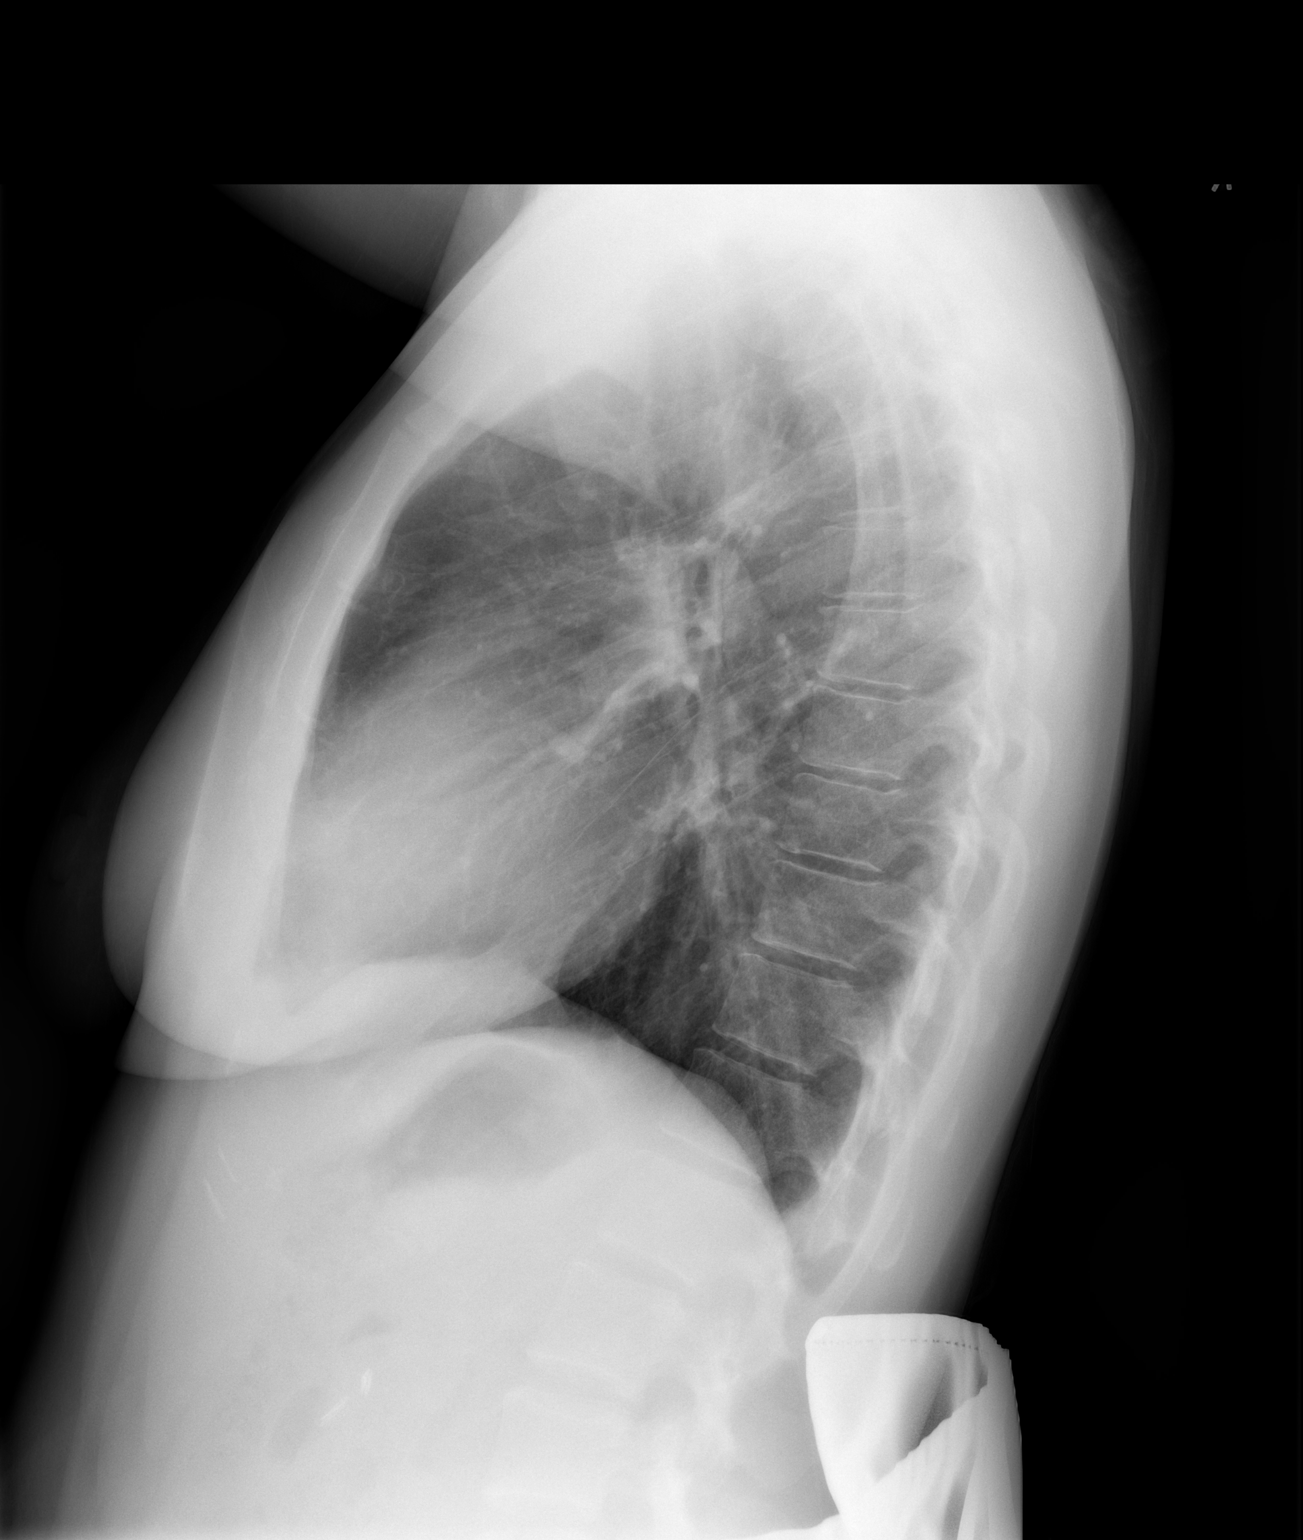

[2 of 2 positions shown; findings below may reference images not displayed]

FINDINGS: The heart size and mediastinal contours are within normal limits.
There is no focal infiltrate, pulmonary edema, or pleural effusion.
Prior cholecystectomy clips are noted. The visualized skeletal
structures are unremarkable.
IMPRESSION: No active cardiopulmonary disease.

## 2018-04-28 ENCOUNTER — Other Ambulatory Visit (HOSPITAL_COMMUNITY)
Admission: RE | Admit: 2018-04-28 | Discharge: 2018-04-28 | Disposition: A | Discharge status: Home / Self Care | Payer: BLUE CROSS/BLUE SHIELD | Source: Ambulatory Visit | Attending: Obstetrics and Gynecology | Admitting: Obstetrics and Gynecology

## 2018-04-28 ENCOUNTER — Other Ambulatory Visit: Payer: Self-pay | Admitting: Obstetrics and Gynecology

## 2018-04-28 DIAGNOSIS — Z01411 Encounter for gynecological examination (general) (routine) with abnormal findings: Secondary | ICD-10-CM | POA: Insufficient documentation

## 2018-05-03 LAB — CYTOLOGY - PAP
Diagnosis: NEGATIVE
HPV: NOT DETECTED

## 2019-01-01 IMAGING — XA DG FLUORO GUIDE NDL PLC/BX
1 series · 1 of 1 positions shown · non-contrast
Comparison: none

CLINICAL DATA: MRI left hip arthrogram.  Pain.

[Series 1: ortho standard · 1 of 1 slices shown]
[im 1/1]
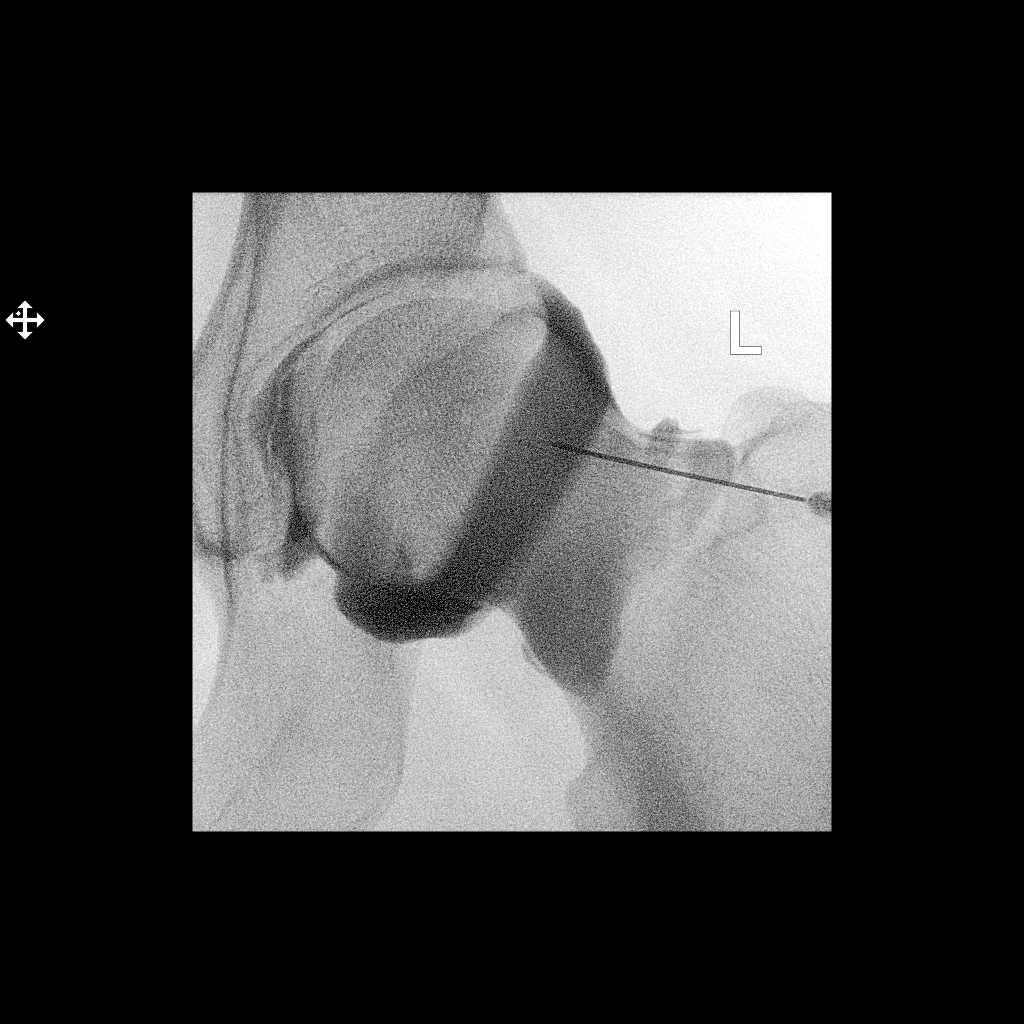

[1 of 1 positions shown; findings below may reference images not displayed]

FLUOROSCOPY TIME:  0 minutes 20 seconds. 19.50 micro gray meter
squared

PROCEDURE:
Left hip joint INJECTION UNDER FLUOROSCOPY

Skin overlying the right hip was scrubbed with Betadine and draped
in sterile fashion. Local anesthesia was carried [DATE]%
lidocaine. 22 gauge spinal needle was directed into the left hip
joint on 1 pass. 16 cc of dilute Isovue 200 mixed with 0.1 cc
MultiHance was then used to fill the hip joint. The procedure was
well-tolerated. The patient was taken MRI by wheelchair.
IMPRESSION: Technically successful left hip joint injection for MRI.

## 2023-02-12 ENCOUNTER — Other Ambulatory Visit (HOSPITAL_COMMUNITY): Payer: Self-pay

## 2023-02-12 MED ORDER — ZEPBOUND 2.5 MG/0.5ML ~~LOC~~ SOAJ
2.5000 mg | SUBCUTANEOUS | 0 refills | Status: DC
  Filled 2023-02-12: qty 2, 28d supply, fill #0

## 2023-02-19 ENCOUNTER — Other Ambulatory Visit: Payer: Self-pay

## 2023-02-19 ENCOUNTER — Encounter (HOSPITAL_BASED_OUTPATIENT_CLINIC_OR_DEPARTMENT_OTHER): Payer: Self-pay | Admitting: Obstetrics and Gynecology

## 2023-02-19 NOTE — Progress Notes (Addendum)
Spoke w/ via phone for pre-op interview---pt Lab needs dos----   urine preg poct , surgery orders req dr Richardson Dopp epic ib           Lab results------none COVID test -----patient states asymptomatic no test needed Arrive at -------1230 pm 03-11-2023 NPO after MN NO Solid Food.  Clear liquids from MN until---1130 am Med rec completed Medications to take morning of surgery -----topiramate, sertraline Diabetic medication -----n/a Patient instructed no nail polish to be worn day of surgery Patient instructed to bring photo id and insurance card day of surgery Patient aware to have Driver (ride ) / caregiver   husband Tamara Wilson  for 24 hours after surgery  Patient Special Instructions -----none Pre-Op special Instructions -----none Patient verbalized understanding of instructions that were given at this phone interview. Patient denies shortness of breath, chest pain, fever, cough at this phone interview.

## 2023-02-21 ENCOUNTER — Other Ambulatory Visit (HOSPITAL_COMMUNITY): Payer: Self-pay

## 2023-03-10 ENCOUNTER — Other Ambulatory Visit: Payer: Self-pay | Admitting: Obstetrics and Gynecology

## 2023-03-10 DIAGNOSIS — R102 Pelvic and perineal pain: Secondary | ICD-10-CM

## 2023-03-10 MED ORDER — BUPIVACAINE LIPOSOME 1.3 % IJ SUSP: 20.0000 mL | Freq: Once | INTRAMUSCULAR | Status: AC

## 2023-03-10 NOTE — H&P (Signed)
Reason for Appointment   1.  Preop/ Pelvic pain  History of Present Illness    General:  42 y/o presents for preop visit. Pt is schedule for a robotic assisted diagnositic laparoscopy with fulguration of endometriosis on 03/11/2023 for the management of pelvic pain.  --- IN REVIEW: Patient reports seeing MD for endometriosis symptoms in her 24's . she reports intermittent pain that resolved when pregnant.  she has a kyleena IUD and the symptoms improved however the last year and half the symptoms have been worse ( ie pelvic  pain, pain during sex,)  ---        GYN ULTRASOUND FINDING ON 10/24/2022:         Uterus 9.08 x 4.12x 5.04 cm/ uterine volume 98.72ml        Uterus WNL/ IUD seen and appears in proper position.        Endom WNL/ Endo 0.51cm        Rt OV WNL        Lt OV complex cyst with low internal echoes and thick wall. 1.5x1.2x1.4cm, avascular.        No free fluid or adnexal masses seen.  Current Medications Taking Scopolamine 1 MG/3DAYS Patch 72 Hour 1 patch to skin behind the ear as needed Transdermal Sertraline HCl 50 MG Tablet TAKE 1 TABLET BY MOUTH EVERY DAY Multivitamin . Tablet 1 tablet by mouth Once daily Vitamin D (Ergocalciferol) 50000 UNIT Capsule 1 capsule Orally Once a Week Kyleena 19.5mg  IUD One Intrauterine remove or replace every 5 years Topiramate 25 MG Tablet 1 tablet Orally Once a day Phentermine HCl 37.5 MG Capsule 1 capsule Orally Once a day Medication List reviewed and reconciled with the patient Past Medical History Allergies. IBS - constipation predominant. Asthma childhood. Endometriosis. H/o Gestational diabetes. Surgical History rhinoplasty 2006 tonsillectomy 1986 Gallbladder 07/2012 Lt ACL replacement 11/24/2017 Left hip Labrum repair 07/19/18 Family History Father: alive, manic depression Mother: alive, manic depression, addict, diagnosed with Diabetes, Hypertension Paternal Grand Father: lung cancer Paternal Grand Mother: pancreatic  cancer, diagnosed with Diabetes, Hypertension Maternal Grand Mother: varicose veins, diagnosed with Hypertension Maternal Grand Father: diagnosed with Diabetes, Hypertension neg gi family hx. Social History    General:  Tobacco use  cigarettes:  Never smoked, Tobacco history last updated  02/25/2023, Vaping  No. EXPOSURE TO PASSIVE SMOKE: no. Alcohol: yes, maybe a beer a week. Caffeine: yes, coffee, 2+ servings daily. Recreational drug use: no. DIET: Avoids red meat and pork. Exercise: runs 3 miles 3-4 times per week. Marital Status: married. Children: 1 girl, 1, Boys. 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 Seat belt use: yes. Gyn History Sexual activity yes, currently sexually active, 1.  Periods : only light spotting with Kyleena.  Denies H/O LMP.  Birth control vasectomy, July 2015 Kyleena IUD 11/01/18.  Last pap smear date 04/28/2018 Neg/HPVneg.  Last mammogram date 07/2022 - benign, 6 mo rpt scheduled.  Denies H/O Abnormal pap smear.  H/O STD HPV.  GYN procedures burned with cream per pt.  OB History Number of pregnancies  2.  Pregnancy # 1  live birth, vaginal delivery, girl.  Pregnancy # 2  live birth, vaginal delivery, boy.  Allergies Sulfa drugs (for allergy): childhood Codeine (for allergy): vomiting Hydrocodone Bitartrate: itching Hospitalization/Major Diagnostic Procedure child birth Review of Systems    CONSTITUTIONAL:  Chills No.  Fatigue No.  Fever No.  Night sweats No.  Recent travel outside [place] No.  Sweats No.  Weight change No.  OPHTHALMOLOGY:  Blurring of vision no.  Change in vision no.  Double vision no.     ENT:  Dizziness no.  Nose bleeds no.  Sore throat no.  Teeth pain no.     ALLERGY:  Hives no.     CARDIOLOGY:  Chest pain no.  High blood pressure no.  Irregular heart beat no.  Leg edema no.  Palpitations no.     RESPIRATORY:  Shortness of breath no.  Cough no.  Wheezing no.     UROLOGY:  Pain with urination no.  Urinary urgency no.   Urinary frequency no.  Urinary incontinence no.  Difficulty urinating No.  Blood in urine No.     GASTROENTEROLOGY:  Abdominal pain no.  Appetite change no.  Bloating/belching no.  Blood in stool or on toilet paper no.  Change in bowel movements no.  Constipation no.  Diarrhea no.  Difficulty swallowing no.  Nausea no.     FEMALE REPRODUCTIVE:  Vulvar pain no.  Vulvar rash no.  Abnormal vaginal bleeding no.  Breast pain no.  Nipple discharge no.  Pain with intercourse no.  Pelvic pain , worse with menstruation.  Unusual vaginal discharge no.  Vaginal itching no.     MUSCULOSKELETAL:  Muscle aches no.     NEUROLOGY:  Headache no.  Tingling/numbness no.  Weakness no.     PSYCHOLOGY:  Depression no.  Anxiety no.  Nervousness no.  Sleep disturbances no.  Suicidal ideation no .     ENDOCRINOLOGY:  Excessive thirst no.  Excessive urination no.  Hair loss no.  Heat or cold intolerance no.     HEMATOLOGY/LYMPH:  Abnormal bleeding no.  Easy bruising no.  Swollen glands no.     DERMATOLOGY:  New/changing skin lesion no.  Rash no.  Sores no.  Vital Signs Wt: 172.8, Wt change: -4 lbs, Ht: 61, BMI: 32.65, Pulse sitting: 71, BP sitting: 109/75. Examination    General Examination: CONSTITUTIONAL:  alert, oriented, NAD.  SKIN:  moist, warm.  EYES:  Conjunctiva clear.  LUNGS:  good I:E efffort noted, clear to auscultation bilaterally.  HEART:  regular rate and rhythm.  ABDOMEN:  soft, non-tender/non-distended, bowel sounds present.  FEMALE GENITOURINARY:  normal external genitalia, labia - unremarkable, vagina - pink moist mucosa, no lesions or abnormal discharge, cervix - no discharge or lesions or CMT, adnexa - no masses or tenderness, uterus - nontender and normal size on palpation.  EXTREMITIES:  no edema present.  PSYCH:  affect normal, good eye contact.  Physical Examination    Chaperone present:  Chaperone present  Debby Freiberg 02/25/2023 11:53:01 AM >, for pelvic exam.  Assessments 1. Pelvic  pain - R10.2 (Primary) Treatment 1. Pelvic pain        Notes: planning robotic assisted diagnositic laparoscopy with fulguration of endometriosis on 03/11/2023 for the management of pelvic pain. r/b/a of surgery discussed with the patient including but not limited to injury to bowel bladder, ureters and surrouding organs wtih the need for further surgery. she is aware that this surgery may not eliminate her pelvic pain. she is advised to avoid eating or drinking after midnight the night prior to her surgery. she should avois NSAID 10 days prior to surgery. All questions were addressed to the patients satifaction during her preoperative visit.

## 2023-03-10 NOTE — Anesthesia Preprocedure Evaluation (Signed)
Anesthesia Evaluation  Patient identified by MRN, date of birth, ID band Patient awake    Reviewed: Allergy & Precautions, NPO status , Patient's Chart, lab work & pertinent test results  History of Anesthesia Complications Negative for: history of anesthetic complications  Airway Mallampati: II  TM Distance: >3 FB Neck ROM: Full    Dental no notable dental hx.    Pulmonary neg pulmonary ROS   Pulmonary exam normal        Cardiovascular negative cardio ROS Normal cardiovascular exam     Neuro/Psych   Anxiety Depression    negative neurological ROS     GI/Hepatic negative GI ROS, Neg liver ROS,,,  Endo/Other    Renal/GU negative Renal ROS  negative genitourinary   Musculoskeletal negative musculoskeletal ROS (+)    Abdominal   Peds  Hematology   Anesthesia Other Findings Day of surgery medications reviewed with patient.  Reproductive/Obstetrics endometriosis                              Anesthesia Physical Anesthesia Plan  ASA: 2  Anesthesia Plan: General   Post-op Pain Management: Tylenol PO (pre-op)* and Toradol IV (intra-op)*   Induction: Intravenous  PONV Risk Score and Plan: 4 or greater and Midazolam, Scopolamine patch - Pre-op, Treatment may vary due to age or medical condition, Dexamethasone and Ondansetron  Airway Management Planned: Oral ETT  Additional Equipment: None  Intra-op Plan:   Post-operative Plan: Extubation in OR  Informed Consent: I have reviewed the patients History and Physical, chart, labs and discussed the procedure including the risks, benefits and alternatives for the proposed anesthesia with the patient or authorized representative who has indicated his/her understanding and acceptance.     Dental advisory given  Plan Discussed with: CRNA  Anesthesia Plan Comments:         Anesthesia Quick Evaluation

## 2023-03-10 NOTE — H&P (Deleted)
  The note originally documented on this encounter has been moved the the encounter in which it belongs.  

## 2023-03-11 ENCOUNTER — Encounter (HOSPITAL_BASED_OUTPATIENT_CLINIC_OR_DEPARTMENT_OTHER): Payer: Self-pay | Admitting: Obstetrics and Gynecology

## 2023-03-11 ENCOUNTER — Encounter (HOSPITAL_BASED_OUTPATIENT_CLINIC_OR_DEPARTMENT_OTHER)
Admission: RE | Disposition: A | Discharge status: Home / Self Care | Payer: Self-pay | Source: Home / Self Care | Attending: Obstetrics and Gynecology

## 2023-03-11 ENCOUNTER — Ambulatory Visit (HOSPITAL_BASED_OUTPATIENT_CLINIC_OR_DEPARTMENT_OTHER): Payer: Managed Care, Other (non HMO) | Admitting: Certified Registered"

## 2023-03-11 ENCOUNTER — Ambulatory Visit (HOSPITAL_BASED_OUTPATIENT_CLINIC_OR_DEPARTMENT_OTHER)
Admission: RE | Admit: 2023-03-11 | Discharge: 2023-03-11 | Disposition: A | Discharge status: Home / Self Care | Payer: Managed Care, Other (non HMO) | Attending: Obstetrics and Gynecology | Admitting: Obstetrics and Gynecology

## 2023-03-11 ENCOUNTER — Other Ambulatory Visit: Payer: Self-pay

## 2023-03-11 DIAGNOSIS — F419 Anxiety disorder, unspecified: Secondary | ICD-10-CM | POA: Diagnosis not present

## 2023-03-11 DIAGNOSIS — R102 Pelvic and perineal pain: Secondary | ICD-10-CM

## 2023-03-11 DIAGNOSIS — F32A Depression, unspecified: Secondary | ICD-10-CM | POA: Diagnosis not present

## 2023-03-11 DIAGNOSIS — Z01818 Encounter for other preprocedural examination: Secondary | ICD-10-CM

## 2023-03-11 DIAGNOSIS — F418 Other specified anxiety disorders: Secondary | ICD-10-CM

## 2023-03-11 HISTORY — DX: Anemia, unspecified: D64.9

## 2023-03-11 HISTORY — DX: Attention-deficit hyperactivity disorder, unspecified type: F90.9

## 2023-03-11 HISTORY — DX: Pelvic and perineal pain: R10.2

## 2023-03-11 HISTORY — DX: Depression, unspecified: F32.A

## 2023-03-11 HISTORY — DX: Anxiety disorder, unspecified: F41.9

## 2023-03-11 HISTORY — DX: Other complications of anesthesia, initial encounter: T88.59XA

## 2023-03-11 LAB — CBC
HCT: 36.9 % (ref 36.0–46.0)
Hemoglobin: 12 g/dL (ref 12.0–15.0)
MCH: 26.9 pg (ref 26.0–34.0)
MCHC: 32.5 g/dL (ref 30.0–36.0)
MCV: 82.7 fL (ref 80.0–100.0)
Platelets: 306 10*3/uL (ref 150–400)
RBC: 4.46 MIL/uL (ref 3.87–5.11)
RDW: 14.6 % (ref 11.5–15.5)
WBC: 7.3 10*3/uL (ref 4.0–10.5)
nRBC: 0 % (ref 0.0–0.2)

## 2023-03-11 LAB — TYPE AND SCREEN
ABO/RH(D): O POS
Antibody Screen: NEGATIVE

## 2023-03-11 LAB — POCT PREGNANCY, URINE: Preg Test, Ur: NEGATIVE

## 2023-03-11 SURGERY — LAPAROSCOPY, DIAGNOSTIC, ROBOT-ASSISTED
Anesthesia: General | Site: Abdomen | Laterality: Bilateral

## 2023-03-11 MED ORDER — FENTANYL CITRATE (PF) 250 MCG/5ML IJ SOLN
INTRAMUSCULAR | Status: DC | PRN
  Administered 2023-03-11: 25 ug via INTRAVENOUS
  Administered 2023-03-11: 50 ug via INTRAVENOUS
  Administered 2023-03-11: 25 ug via INTRAVENOUS
  Administered 2023-03-11: 50 ug via INTRAVENOUS

## 2023-03-11 MED ORDER — PHENYLEPHRINE 80 MCG/ML (10ML) SYRINGE FOR IV PUSH (FOR BLOOD PRESSURE SUPPORT)
PREFILLED_SYRINGE | INTRAVENOUS | Status: DC | PRN
  Administered 2023-03-11: 80 ug via INTRAVENOUS

## 2023-03-11 MED ORDER — ROCURONIUM BROMIDE 10 MG/ML (PF) SYRINGE
PREFILLED_SYRINGE | INTRAVENOUS | Status: AC
  Filled 2023-03-11: qty 30

## 2023-03-11 MED ORDER — ONDANSETRON HCL 4 MG/2ML IJ SOLN
INTRAMUSCULAR | Status: DC | PRN
  Administered 2023-03-11: 4 mg via INTRAVENOUS

## 2023-03-11 MED ORDER — SODIUM CHLORIDE 0.9 % IR SOLN
Status: DC | PRN
  Administered 2023-03-11: 1000 mL

## 2023-03-11 MED ORDER — ONDANSETRON HCL 4 MG/2ML IJ SOLN
INTRAMUSCULAR | Status: AC
  Filled 2023-03-11: qty 6

## 2023-03-11 MED ORDER — KETOROLAC TROMETHAMINE 30 MG/ML IJ SOLN
INTRAMUSCULAR | Status: AC
  Filled 2023-03-11: qty 3

## 2023-03-11 MED ORDER — LIDOCAINE 2% (20 MG/ML) 5 ML SYRINGE
INTRAMUSCULAR | Status: DC | PRN
  Administered 2023-03-11: 100 mg via INTRAVENOUS

## 2023-03-11 MED ORDER — LACTATED RINGERS IV SOLN: INTRAVENOUS | Status: DC

## 2023-03-11 MED ORDER — ACETAMINOPHEN 500 MG PO TABS: 1000.0000 mg | ORAL_TABLET | ORAL | Status: DC

## 2023-03-11 MED ORDER — IBUPROFEN 800 MG PO TABS
800.0000 mg | ORAL_TABLET | Freq: Three times a day (TID) | ORAL | 1 refills | Status: AC | PRN
Start: 2023-03-11 — End: ?

## 2023-03-11 MED ORDER — DIPHENHYDRAMINE HCL 50 MG/ML IJ SOLN
INTRAMUSCULAR | Status: DC | PRN
  Administered 2023-03-11: 12.5 mg via INTRAVENOUS

## 2023-03-11 MED ORDER — GABAPENTIN 300 MG PO CAPS
300.0000 mg | ORAL_CAPSULE | ORAL | Status: AC
  Administered 2023-03-11: 300 mg via ORAL

## 2023-03-11 MED ORDER — ACETAMINOPHEN 500 MG PO TABS
1000.0000 mg | ORAL_TABLET | Freq: Three times a day (TID) | ORAL | 0 refills | Status: AC | PRN
Start: 2023-03-11 — End: ?

## 2023-03-11 MED ORDER — STERILE WATER FOR IRRIGATION IR SOLN
Status: DC | PRN
  Administered 2023-03-11: 500 mL

## 2023-03-11 MED ORDER — PROPOFOL 10 MG/ML IV BOLUS
INTRAVENOUS | Status: AC
  Filled 2023-03-11: qty 20

## 2023-03-11 MED ORDER — SILVER NITRATE-POT NITRATE 75-25 % EX MISC
CUTANEOUS | Status: DC | PRN
  Administered 2023-03-11: 2

## 2023-03-11 MED ORDER — SUGAMMADEX SODIUM 200 MG/2ML IV SOLN
INTRAVENOUS | Status: DC | PRN
  Administered 2023-03-11: 300 mg via INTRAVENOUS

## 2023-03-11 MED ORDER — MIDAZOLAM HCL 2 MG/2ML IJ SOLN
INTRAMUSCULAR | Status: DC | PRN
  Administered 2023-03-11: 2 mg via INTRAVENOUS

## 2023-03-11 MED ORDER — GABAPENTIN 300 MG PO CAPS
ORAL_CAPSULE | ORAL | Status: AC
  Filled 2023-03-11: qty 1

## 2023-03-11 MED ORDER — PHENYLEPHRINE 80 MCG/ML (10ML) SYRINGE FOR IV PUSH (FOR BLOOD PRESSURE SUPPORT)
PREFILLED_SYRINGE | INTRAVENOUS | Status: AC
  Filled 2023-03-11: qty 20

## 2023-03-11 MED ORDER — MIDAZOLAM HCL 2 MG/2ML IJ SOLN
INTRAMUSCULAR | Status: AC
  Filled 2023-03-11: qty 2

## 2023-03-11 MED ORDER — ACETAMINOPHEN 500 MG PO TABS
ORAL_TABLET | ORAL | Status: AC
  Filled 2023-03-11: qty 2

## 2023-03-11 MED ORDER — OXYCODONE HCL 5 MG PO TABS
5.0000 mg | ORAL_TABLET | Freq: Four times a day (QID) | ORAL | 0 refills | Status: AC | PRN
Start: 2023-03-11 — End: ?

## 2023-03-11 MED ORDER — SCOPOLAMINE 1 MG/3DAYS TD PT72
MEDICATED_PATCH | TRANSDERMAL | Status: AC
  Filled 2023-03-11: qty 1

## 2023-03-11 MED ORDER — ROCURONIUM BROMIDE 10 MG/ML (PF) SYRINGE
PREFILLED_SYRINGE | INTRAVENOUS | Status: DC | PRN
  Administered 2023-03-11: 60 mg via INTRAVENOUS

## 2023-03-11 MED ORDER — KETOROLAC TROMETHAMINE 30 MG/ML IJ SOLN
INTRAMUSCULAR | Status: DC | PRN
  Administered 2023-03-11: 30 mg via INTRAVENOUS

## 2023-03-11 MED ORDER — SODIUM CHLORIDE 0.9 % IV SOLN
2.0000 g | INTRAVENOUS | Status: AC
  Administered 2023-03-11: 2 g via INTRAVENOUS

## 2023-03-11 MED ORDER — ACETAMINOPHEN 500 MG PO TABS
1000.0000 mg | ORAL_TABLET | Freq: Once | ORAL | Status: AC
  Administered 2023-03-11: 1000 mg via ORAL

## 2023-03-11 MED ORDER — GLYCOPYRROLATE 0.2 MG/ML IJ SOLN
INTRAMUSCULAR | Status: DC | PRN
  Administered 2023-03-11: .2 mg via INTRAVENOUS

## 2023-03-11 MED ORDER — PROPOFOL 10 MG/ML IV BOLUS
INTRAVENOUS | Status: DC | PRN
  Administered 2023-03-11: 150 mg via INTRAVENOUS

## 2023-03-11 MED ORDER — POVIDONE-IODINE 10 % EX SWAB: 2.0000 | Freq: Once | CUTANEOUS | Status: DC

## 2023-03-11 MED ORDER — FENTANYL CITRATE (PF) 250 MCG/5ML IJ SOLN
INTRAMUSCULAR | Status: AC
  Filled 2023-03-11: qty 5

## 2023-03-11 MED ORDER — SODIUM CHLORIDE 0.9 % IV SOLN
INTRAVENOUS | Status: AC
  Filled 2023-03-11: qty 2

## 2023-03-11 MED ORDER — EPHEDRINE SULFATE-NACL 50-0.9 MG/10ML-% IV SOSY
PREFILLED_SYRINGE | INTRAVENOUS | Status: DC | PRN
  Administered 2023-03-11: 10 mg via INTRAVENOUS
  Administered 2023-03-11: 5 mg via INTRAVENOUS

## 2023-03-11 MED ORDER — DEXAMETHASONE SODIUM PHOSPHATE 10 MG/ML IJ SOLN
INTRAMUSCULAR | Status: DC | PRN
  Administered 2023-03-11: 10 mg via INTRAVENOUS

## 2023-03-11 MED ORDER — LIDOCAINE HCL (PF) 2 % IJ SOLN
INTRAMUSCULAR | Status: AC
  Filled 2023-03-11: qty 15

## 2023-03-11 MED ORDER — BUPIVACAINE HCL 0.25 % IJ SOLN
INTRAMUSCULAR | Status: DC | PRN
  Administered 2023-03-11: 20 mL

## 2023-03-11 MED ORDER — SCOPOLAMINE 1 MG/3DAYS TD PT72
1.0000 | MEDICATED_PATCH | Freq: Once | TRANSDERMAL | Status: DC
  Administered 2023-03-11: 1.5 mg via TRANSDERMAL

## 2023-03-11 MED ORDER — BUPIVACAINE LIPOSOME 1.3 % IJ SUSP
INTRAMUSCULAR | Status: DC | PRN
  Administered 2023-03-11: 20 mL

## 2023-03-11 MED ORDER — DEXAMETHASONE SODIUM PHOSPHATE 10 MG/ML IJ SOLN
INTRAMUSCULAR | Status: AC
  Filled 2023-03-11: qty 3

## 2023-03-11 SURGICAL SUPPLY — 53 items
ADH SKN CLS APL DERMABOND .7 (GAUZE/BANDAGES/DRESSINGS) ×1
BAG DRN RND TRDRP ANRFLXCHMBR (UROLOGICAL SUPPLIES) ×1
BAG URINE DRAIN 2000ML AR STRL (UROLOGICAL SUPPLIES) ×2 IMPLANT
CANNULA CAP OBTURATR AIRSEAL 8 (CAP) IMPLANT
CATH FOLEY 3WAY 5CC 16FR (CATHETERS) ×2 IMPLANT
COVER BACK TABLE 60X90IN (DRAPES) ×2 IMPLANT
COVER TIP SHEARS 8 DVNC (MISCELLANEOUS) ×2 IMPLANT
DEFOGGER SCOPE WARMER CLEARIFY (MISCELLANEOUS) ×2 IMPLANT
DERMABOND ADVANCED .7 DNX12 (GAUZE/BANDAGES/DRESSINGS) ×2 IMPLANT
DILATOR CANAL MILEX (MISCELLANEOUS) ×2 IMPLANT
DRAPE ARM DVNC X/XI (DISPOSABLE) ×8 IMPLANT
DRAPE COLUMN DVNC XI (DISPOSABLE) ×2 IMPLANT
DRAPE SURG IRRIG POUCH 19X23 (DRAPES) ×2 IMPLANT
DRAPE UTILITY XL STRL (DRAPES) ×2 IMPLANT
DRSG TELFA 3X8 NADH STRL (GAUZE/BANDAGES/DRESSINGS) IMPLANT
DURAPREP 26ML APPLICATOR (WOUND CARE) ×2 IMPLANT
ELECT REM PT RETURN 9FT ADLT (ELECTROSURGICAL) ×1
ELECTRODE REM PT RTRN 9FT ADLT (ELECTROSURGICAL) ×2 IMPLANT
FORCEPS BPLR LNG DVNC XI (INSTRUMENTS) ×2 IMPLANT
GAUZE 4X4 16PLY ~~LOC~~+RFID DBL (SPONGE) IMPLANT
GLOVE BIOGEL M 6.5 STRL (GLOVE) ×6 IMPLANT
GLOVE BIOGEL PI IND STRL 6.5 (GLOVE) ×6 IMPLANT
GLOVE BIOGEL PI IND STRL 7.0 (GLOVE) IMPLANT
GLOVE SURG SS PI 6.5 STRL IVOR (GLOVE) IMPLANT
GOWN STRL REUS W/ TWL LRG LVL3 (GOWN DISPOSABLE) IMPLANT
GOWN STRL REUS W/TWL LRG LVL3 (GOWN DISPOSABLE) ×2
IRRIG SUCT STRYKERFLOW 2 WTIP (MISCELLANEOUS) ×1
IRRIGATION SUCT STRKRFLW 2 WTP (MISCELLANEOUS) ×2 IMPLANT
KIT PINK PAD W/HEAD ARE REST (MISCELLANEOUS) ×1
KIT PINK PAD W/HEAD ARM REST (MISCELLANEOUS) ×2 IMPLANT
KIT TURNOVER CYSTO (KITS) ×2 IMPLANT
LEGGING LITHOTOMY PAIR STRL (DRAPES) ×2 IMPLANT
MANIFOLD NEPTUNE II (INSTRUMENTS) ×2 IMPLANT
OBTURATOR OPTICAL STND 8 DVNC (TROCAR) ×1
OBTURATOR OPTICALSTD 8 DVNC (TROCAR) ×2 IMPLANT
OCCLUDER COLPOPNEUMO (BALLOONS) ×2 IMPLANT
PACK ROBOT WH (CUSTOM PROCEDURE TRAY) ×2 IMPLANT
PACK ROBOTIC GOWN (GOWN DISPOSABLE) ×2 IMPLANT
PAD OB MATERNITY 4.3X12.25 (PERSONAL CARE ITEMS) ×2 IMPLANT
PAD PREP 24X48 CUFFED NSTRL (MISCELLANEOUS) ×2 IMPLANT
PROTECTOR NERVE ULNAR (MISCELLANEOUS) ×2 IMPLANT
SCISSORS MNPLR CVD DVNC XI (INSTRUMENTS) ×2 IMPLANT
SEAL UNIV 5-12 XI (MISCELLANEOUS) ×4 IMPLANT
SET TUBE FILTERED XL AIRSEAL (SET/KITS/TRAYS/PACK) IMPLANT
SLEEVE SCD COMPRESS KNEE MED (STOCKING) ×2 IMPLANT
SPIKE FLUID TRANSFER (MISCELLANEOUS) ×4 IMPLANT
SUT VIC AB 0 CT1 27 (SUTURE) ×2
SUT VIC AB 0 CT1 27XBRD ANBCTR (SUTURE) ×4 IMPLANT
SUT VICRYL 0 UR6 27IN ABS (SUTURE) IMPLANT
SUT VICRYL RAPIDE 4/0 PS 2 (SUTURE) ×4 IMPLANT
SUT VLOC 180 0 9IN GS21 (SUTURE) ×2 IMPLANT
TOWEL OR 17X24 6PK STRL BLUE (TOWEL DISPOSABLE) ×2 IMPLANT
WATER STERILE IRR 1000ML POUR (IV SOLUTION) ×2 IMPLANT

## 2023-03-11 NOTE — Discharge Instructions (Signed)
  Post Anesthesia Home Care Instructions  Activity: Get plenty of rest for the remainder of the day. A responsible individual must stay with you for 24 hours following the procedure.  For the next 24 hours, DO NOT: -Drive a car -Advertising copywriter -Drink alcoholic beverages -Take any medication unless instructed by your physician -Make any legal decisions or sign important papers.  Meals: Start with liquid foods such as gelatin or soup. Progress to regular foods as tolerated. Avoid greasy, spicy, heavy foods. If nausea and/or vomiting occur, drink only clear liquids until the nausea and/or vomiting subsides. Call your physician if vomiting continues.  Special Instructions/Symptoms: Your throat may feel dry or sore from the anesthesia or the breathing tube placed in your throat during surgery. If this causes discomfort, gargle with warm salt water. The discomfort should disappear within 24 hours.  If you had a scopolamine patch placed behind your ear for the management of post- operative nausea and/or vomiting:  1. The medication in the patch is effective for 72 hours, after which it should be removed.  Wrap patch in a tissue and discard in the trash. Wash hands thoroughly with soap and water. 2. You may remove the patch earlier than 72 hours if you experience unpleasant side effects which may include dry mouth, dizziness or visual disturbances. 3. Avoid touching the patch. Wash your hands with soap and water after contact with the patch.    Remove patch behind left ear by Saturday, March 14, 2023.  Information for Discharge Teaching: EXPAREL (bupivacaine liposome injectable suspension)   Your surgeon or anesthesiologist gave you EXPAREL(bupivacaine) to help control your pain after surgery.  EXPAREL is a local anesthetic that provides pain relief by numbing the tissue around the surgical site. EXPAREL is designed to release pain medication over time and can control pain for up to 72  hours. Depending on how you respond to EXPAREL, you may require less pain medication during your recovery.  Possible side effects: Temporary loss of sensation or ability to move in the area where bupivacaine was injected. Nausea, vomiting, constipation Rarely, numbness and tingling in your mouth or lips, lightheadedness, or anxiety may occur. Call your doctor right away if you think you may be experiencing any of these sensations, or if you have other questions regarding possible side effects.  Follow all other discharge instructions given to you by your surgeon or nurse. Eat a healthy diet and drink plenty of water or other fluids.  If you return to the hospital for any reason within 96 hours following the administration of EXPAREL, it is important for health care providers to know that you have received this anesthetic. A teal colored band has been placed on your arm with the date, time and amount of EXPAREL you have received in order to alert and inform your health care providers. Please leave this armband in place for the full 96 hours following administration, and then you may remove the band.  Do not remove green armband before Sunday, March 15, 2023.

## 2023-03-11 NOTE — Transfer of Care (Signed)
Immediate Anesthesia Transfer of Care Note  Patient: Tamara Wilson  Procedure(s) Performed: XI ROBOTIC ASSISTED DIAGNOSTIC LAPAROSCOPY WITH EXCISION OF ENDOMETRIOSIS (Bilateral: Abdomen)  Patient Location: PACU  Anesthesia Type:General  Level of Consciousness: drowsy and patient cooperative  Airway & Oxygen Therapy: Patient Spontanous Breathing and Patient connected to nasal cannula oxygen  Post-op Assessment: Report given to RN and Post -op Vital signs reviewed and stable  Post vital signs: Reviewed and stable  Last Vitals:  Vitals Value Taken Time  BP 114/71 03/11/23 1536  Temp    Pulse 67 03/11/23 1538  Resp 16 03/11/23 1538  SpO2 99 % 03/11/23 1538  Vitals shown include unvalidated device data.  Last Pain:  Vitals:   03/11/23 1253  TempSrc: Oral  PainSc: 0-No pain      Patients Stated Pain Goal: 7 (03/11/23 1253)  Complications: No notable events documented.

## 2023-03-11 NOTE — H&P (Signed)
Date of Initial H&P: 03/10/2023  History reviewed, patient examined, no change in status, stable for surgery.

## 2023-03-11 NOTE — Anesthesia Procedure Notes (Signed)
Procedure Name: Intubation Date/Time: 03/11/2023 2:14 PM  Performed by: Dairl Ponder, CRNAPre-anesthesia Checklist: Patient identified, Emergency Drugs available, Suction available and Patient being monitored Patient Re-evaluated:Patient Re-evaluated prior to induction Oxygen Delivery Method: Circle System Utilized Preoxygenation: Pre-oxygenation with 100% oxygen Induction Type: IV induction Ventilation: Mask ventilation without difficulty Laryngoscope Size: Mac and 3 Grade View: Grade I Tube type: Oral Tube size: 7.0 mm Number of attempts: 1 Airway Equipment and Method: Stylet and Oral airway Placement Confirmation: ETT inserted through vocal cords under direct vision, positive ETCO2 and breath sounds checked- equal and bilateral Secured at: 21 cm Tube secured with: Tape Dental Injury: Teeth and Oropharynx as per pre-operative assessment

## 2023-03-11 NOTE — Op Note (Signed)
03/11/2023  4:09 PM  PATIENT:  Tamara Wilson  42 y.o. female  PRE-OPERATIVE DIAGNOSIS:  Pelvic Pain  POST-OPERATIVE DIAGNOSIS:  Pelvic Pain  PROCEDURE:  Procedure(s): XI ROBOTIC ASSISTED DIAGNOSTIC LAPAROSCOPY WITH EXCISION OF ENDOMETRIOSIS (Bilateral)  SURGEON:  Surgeon(s) and Role:    Gerald Leitz, MD - Primary  PHYSICIAN ASSISTANT:   ASSISTANTS: Chasity Steward assisted due to complexity of anatomy    ANESTHESIA: General   EBL:  5 mL   BLOOD ADMINISTERED:none  DRAINS: Urinary Catheter (Foley)   LOCAL MEDICATIONS USED:  OTHER exparel with marcaine  SPECIMEN:  Source of Specimen:  biopsy of the peritoneum covering the  left uterosacral ligament   DISPOSITION OF SPECIMEN:  PATHOLOGY  COUNTS:  YES  TOURNIQUET:  * No tourniquets in log *  DICTATION: .Note written in EPIC  PLAN OF CARE: Discharge to home after PACU  PATIENT DISPOSITION:  PACU - hemodynamically stable.   Delay start of Pharmacological VTE agent (>24hrs) due to surgical blood loss or risk of bleeding: not applicable  Findings: Normal appearing fallopian tube  ovaries and uterus. No distinct endometrial implants are noted.   Procedure: The patient was taken to the operating room #5 [facility] where she was placed under general anesthesia. She was placed in dorsal lithotomy position and prepped and draped in the usual sterile fashion. A speculum was placed.  The anterior lip of the cervix was grasped with a single-tooth tenaculum.  The uterus was sounded to 8 cm m.   A uterine manipulator was placed without difficulty.   Attention was turned to the patient's abdomen where an eight mm skin incision was made at  the umbilicus..  The pneumoperitoneum was achieved with CO2 gas.  An 8mm incision was made in the right upper quadrant and an 8 mm trocar was placed 10 centimeters from the umbilicus.later connected to robotic arm #3). An incision was made in the left upper quadrant TROCAR WAS PLACED 10 cm from the  umbilicus. Later connected to robotic arm #1.  ( All incision sites were injected with 10cc of exparel and marcaine prior to port placement. )  Once all ports had been placed under direct visualization.The laparoscope was removed and the da Vinci robotic system was thin right-sided docked. The robotic arms were connected to the corresponding trocars as listed above. The laparoscope was then reinserted. The PK bipolar cautery was placed into port #1. The monopolar scissor placed in the port #2. All instruments were directed into the pelvis under direct visualization.  Attention was turned to the surgeons console. The pelvic anatomy was examined and appeared normal. A biopsy of a left utero-sacral ligament was obtained using scissors. It was removed and sent to pathology. All instruments removed from the ports. The pneumoperitoneum was released.  The ports were removed. The skin incisions were closed with 4-0 Vicryl and then covered with Dermabond.  The uterine manipulator was removed.  Sponge lap and needle counts were correct x2. The patient was awakened from anesthesia and taken to the recovery room in stable condition.

## 2023-03-12 LAB — SURGICAL PATHOLOGY

## 2023-03-12 NOTE — Anesthesia Postprocedure Evaluation (Signed)
Anesthesia Post Note  Patient: Tamara Wilson  Procedure(s) Performed: XI ROBOTIC ASSISTED DIAGNOSTIC LAPAROSCOPY WITH EXCISION OF ENDOMETRIOSIS (Bilateral: Abdomen)     Patient location during evaluation: PACU Anesthesia Type: General Level of consciousness: awake and alert Pain management: pain level controlled Vital Signs Assessment: post-procedure vital signs reviewed and stable Respiratory status: spontaneous breathing, nonlabored ventilation and respiratory function stable Cardiovascular status: blood pressure returned to baseline Postop Assessment: no apparent nausea or vomiting Anesthetic complications: no   No notable events documented.  Last Vitals:  Vitals:   03/11/23 1615 03/11/23 1650  BP: 107/75 106/74  Pulse: (!) 54 67  Resp: 17 16  Temp:  (!) 36.4 C  SpO2: 94% 100%                  Shanda Howells
